# Patient Record
Sex: Female | Born: 1995 | Race: White | Hispanic: No | Marital: Single | Smoking: Never smoker
Health system: Southern US, Community
[De-identification: ages and names within clinical notes are randomized; demographics above are authoritative.]

## PROBLEM LIST (undated history)

## (undated) DIAGNOSIS — F32A Depression, unspecified: Secondary | ICD-10-CM

## (undated) DIAGNOSIS — F419 Anxiety disorder, unspecified: Secondary | ICD-10-CM

## (undated) DIAGNOSIS — N179 Acute kidney failure, unspecified: Secondary | ICD-10-CM

## (undated) DIAGNOSIS — F329 Major depressive disorder, single episode, unspecified: Secondary | ICD-10-CM

## (undated) HISTORY — DX: Acute kidney failure, unspecified: N17.9

## (undated) HISTORY — PX: OTHER SURGICAL HISTORY: SHX169

## (undated) HISTORY — PX: WISDOM TOOTH EXTRACTION: SHX21

---
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
## 2016-10-06 DIAGNOSIS — K219 Gastro-esophageal reflux disease without esophagitis: Secondary | ICD-10-CM

## 2016-10-06 HISTORY — DX: Gastro-esophageal reflux disease without esophagitis: K21.9

## 2019-10-26 ENCOUNTER — Ambulatory Visit: Payer: 59 | Admitting: Adult Health

## 2019-10-26 DIAGNOSIS — F411 Generalized anxiety disorder: Secondary | ICD-10-CM

## 2019-10-26 DIAGNOSIS — F909 Attention-deficit hyperactivity disorder, unspecified type: Secondary | ICD-10-CM

## 2019-10-26 DIAGNOSIS — F331 Major depressive disorder, recurrent, moderate: Secondary | ICD-10-CM

## 2019-11-03 ENCOUNTER — Ambulatory Visit: Payer: 59 | Admitting: Adult Health

## 2019-11-03 DIAGNOSIS — F489 Nonpsychotic mental disorder, unspecified: Secondary | ICD-10-CM

## 2019-11-03 NOTE — Progress Notes (Deleted)
[facility] MD/PA/NP Initial Note  11/03/2019 9:04 AM Wendy Vega  MRN:  [id number]   .  Chief Complaint:  Chief Complaint    ADHD      HPI: ***  Visit Diagnosis: No diagnosis found.  Past Psychiatric History:   Past Medical History:  Past Medical History:  Diagnosis Date  . Anxiety   . Depression     Past Surgical History:  Procedure Laterality Date  . left arm surgery       Family Psychiatric History: ***  Family History:  Family History  Problem Relation Age of Onset  . Cancer Other   . Hyperlipidemia Other     Social History:  Social History   Socioeconomic History  . Marital status: Single    Spouse name: Not on file  . Number of children: Not on file  . Years of education: Not on file  . Highest education level: Not on file  Occupational History  . Not on file  Tobacco Use  . Smoking status: Never Smoker  Substance and Sexual Activity  . Alcohol use: Yes    Comment: occ  . Drug use: No  . Sexual activity: Yes    Birth control/protection: None  Other Topics Concern  . Not on file  Social History Narrative  . Not on file   Social Determinants of Health   Financial Resource Strain:   . Difficulty of Paying Living Expenses: Not on file  Food Insecurity:   . Worried About Programme researcher, broadcasting/film/video in the Last Year: Not on file  . Ran Out of Food in the Last Year: Not on file  Transportation Needs:   . Lack of Transportation (Medical): Not on file  . Lack of Transportation (Non-Medical): Not on file  Physical Activity:   . Days of Exercise per Week: Not on file  . Minutes of Exercise per Session: Not on file  Stress:   . Feeling of Stress : Not on file  Social Connections:   . Frequency of Communication with Friends and Family: Not on file  . Frequency of Social Gatherings with Friends and Family: Not on file  . Attends Religious Services: Not on file  . Active Member of Clubs or Organizations: Not on file  . Attends Banker  Meetings: Not on file  . Marital Status: Not on file    Allergies:  Allergies  Allergen Reactions  . Sulfa Antibiotics Other (See Comments)    Lips turn blue and swelling   . Sulfacetamide Sodium Other (See Comments)    Lips turn blue and swelling     Metabolic Disorder Labs: No results found for: HGBA1C, MPG No results found for: PROLACTIN No results found for: CHOL, TRIG, HDL, CHOLHDL, VLDL, LDLCALC No results found for: TSH  Therapeutic Level Labs: No results found for: LITHIUM No results found for: VALPROATE No components found for:  CBMZ  Current Medications: Current Outpatient Medications  Medication Sig Dispense Refill  . acetaminophen (TYLENOL) 325 MG tablet Take 2 tablets (650 mg total) by mouth every 6 (six) hours as needed. 30 tablet 0  . buPROPion (WELLBUTRIN SR) 150 MG 12 hr tablet Take 150 mg by mouth 2 (two) times daily.    Marland Kitchen dicyclomine (BENTYL) 20 MG tablet Take 1 tablet (20 mg total) by mouth 2 (two) times daily. 20 tablet 0  . doxycycline (VIBRAMYCIN) 100 MG capsule Take 1 capsule (100 mg total) by mouth 2 (two) times daily. 20 capsule 0  .  fluticasone (CUTIVATE) 0.05 % cream Apply topically 2 (two) times daily. 30 g 0  . gabapentin (NEURONTIN) 300 MG capsule Take 300 mg by mouth 3 (three) times daily.    Marland Kitchen ibuprofen (ADVIL,MOTRIN) 200 MG tablet Take 400 mg by mouth every 6 (six) hours as needed for moderate pain.    Marland Kitchen ibuprofen (ADVIL,MOTRIN) 400 MG tablet Take 1 tablet (400 mg total) by mouth every 6 (six) hours as needed. 30 tablet 0  . naproxen sodium (ANAPROX) 220 MG tablet Take 440 mg by mouth 2 (two) times daily as needed (pain).    . ondansetron (ZOFRAN ODT) 4 MG disintegrating tablet Take 1 tablet (4 mg total) by mouth every 8 (eight) hours as needed for nausea. 10 tablet 0  . Sertraline HCl (ZOLOFT PO) Take by mouth.     No current facility-administered medications for this visit.    Medication Side Effects: {Medication Side Effects  (Optional):12147}  Orders placed this visit:  No orders of the defined types were placed in this encounter.   Psychiatric Specialty Exam:  Review of Systems  There were no vitals taken for this visit.There is no height or weight on file to calculate BMI.  General Appearance: {[phone]}  Eye Contact:  {PSY:22684}  Speech:  {PSY:250-246-1841}  Volume:  {PSY:22686}  Mood:  {PSY:22306}  Affect:  {PSY:2764519662}  Thought Process:  {PSY:22688}  Orientation:  {PSY:22689}  Thought Content: {PSY:22690}   Suicidal Thoughts:  {PSY:22692}  Homicidal Thoughts:  {PSY:22692}  Memory:  {PSY:360-089-5942}  Judgement:  {PSY:22694}  Insight:  {PSY:22695}  Psychomotor Activity:  {PSY:22696}  Concentration:  {BOF:75102}  Recall:  {HEN:27782}  Fund of Knowledge: {PSY:22877}  Language: {UMP:53614}  Assets:  {ERX:54008}  ADL's:  {QPY:19509}  Cognition: {TOI:712458099}  Prognosis:  {PSY:22877}   Screenings:   Receiving Psychotherapy: {IPJ:82505}  Treatment Plan/Recommendations: ***    Aloha Gell, NP

## 2019-11-16 ENCOUNTER — Ambulatory Visit (INDEPENDENT_AMBULATORY_CARE_PROVIDER_SITE_OTHER): Payer: 59 | Admitting: Adult Health

## 2019-11-16 ENCOUNTER — Encounter: Payer: Self-pay | Admitting: Adult Health

## 2019-11-16 DIAGNOSIS — F5081 Binge eating disorder: Secondary | ICD-10-CM | POA: Diagnosis not present

## 2019-11-16 DIAGNOSIS — F331 Major depressive disorder, recurrent, moderate: Secondary | ICD-10-CM

## 2019-11-16 DIAGNOSIS — F909 Attention-deficit hyperactivity disorder, unspecified type: Secondary | ICD-10-CM | POA: Diagnosis not present

## 2019-11-16 DIAGNOSIS — F411 Generalized anxiety disorder: Secondary | ICD-10-CM | POA: Diagnosis not present

## 2019-11-16 MED ORDER — LISDEXAMFETAMINE DIMESYLATE 20 MG PO CAPS: 20.0000 mg | ORAL_CAPSULE | Freq: Two times a day (BID) | ORAL | 0 refills | Status: DC

## 2019-11-16 MED ORDER — ESCITALOPRAM OXALATE 20 MG PO TABS: 20.0000 mg | ORAL_TABLET | Freq: Two times a day (BID) | ORAL | 5 refills | Status: DC

## 2019-11-16 NOTE — Progress Notes (Signed)
[facility] MD/PA/NP Initial Note  11/16/2019 4:56 PM Wendy Vega  MRN:  [id number]  Chief Complaint:  Chief Complaint    ADHD; Anxiety; Depression      HPI:   Describes mood today as "ok". Pleasant. Mood symptoms - denies depression, anxiety, and irritability. Stating "i'm doing pretty good right now". Feels like medications are working "well". Stable interest and motivation. Taking medications as prescribed.  Energy levels stable. Active, does not have a regular exercise routine. Walking some days. Plans to get a part time job.  Enjoys some usual interests and activities. Single. Lives with 2 roommates. No pets. Coming home "some". Spending time with family. Appetite adequate. Weight gain with Covid. Denies any issues with "eating disorder". Sleeps well most nights. Averages 10 to 12 hours. Focus and concentration stable with Vyvanse. 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 Completing tasks. Managing aspects of household.  Denies SI or HI. Denies AH or VH.  Previous medication trials: Wellbutrin, Zoloft  Visit Diagnosis:    ICD-10-CM   1. Binge eating disorder  F50.81 lisdexamfetamine (VYVANSE) 20 MG capsule    lisdexamfetamine (VYVANSE) 20 MG capsule    lisdexamfetamine (VYVANSE) 20 MG capsule  2. Generalized anxiety disorder  F41.1 escitalopram (LEXAPRO) 20 MG tablet  3. Major depressive disorder, recurrent episode, moderate (HCC)  F33.1 escitalopram (LEXAPRO) 20 MG tablet  4. Attention deficit hyperactivity disorder (ADHD), unspecified ADHD type  F90.9 lisdexamfetamine (VYVANSE) 20 MG capsule    lisdexamfetamine (VYVANSE) 20 MG capsule    lisdexamfetamine (VYVANSE) 20 MG capsule    Past Psychiatric History: Denies psychiatric hospitalization.  Past Medical History:  Past Medical History:  Diagnosis Date  . Anxiety   . Depression     Past Surgical History:  Procedure Laterality Date  . left arm surgery       Family Psychiatric History: Denies any family  history of mental illness.  Family History:  Family History  Problem Relation Age of Onset  . Cancer Other   . Hyperlipidemia Other     Social History:  Social History   Socioeconomic History  . Marital status: Single    Spouse name: Not on file  . Number of children: Not on file  . Years of education: Not on file  . Highest education level: Not on file  Occupational History  . Not on file  Tobacco Use  . Smoking status: Never Smoker  Substance and Sexual Activity  . Alcohol use: Yes    Comment: occ  . Drug use: No  . Sexual activity: Yes    Birth control/protection: None  Other Topics Concern  . Not on file  Social History Narrative  . Not on file   Social Determinants of Health   Financial Resource Strain:   . Difficulty of Paying Living Expenses: Not on file  Food Insecurity:   . Worried About Programme researcher, broadcasting/film/video in the Last Year: Not on file  . Ran Out of Food in the Last Year: Not on file  Transportation Needs:   . Lack of Transportation (Medical): Not on file  . Lack of Transportation (Non-Medical): Not on file  Physical Activity:   . Days of Exercise per Week: Not on file  . Minutes of Exercise per Session: Not on file  Stress:   . Feeling of Stress : Not on file  Social Connections:   . Frequency of Communication with Friends and Family: Not on file  . Frequency of Social Gatherings with  Friends and Family: Not on file  . Attends Religious Services: Not on file  . Active Member of Clubs or Organizations: Not on file  . Attends Archivist Meetings: Not on file  . Marital Status: Not on file    Allergies:  Allergies  Allergen Reactions  . Sulfa Antibiotics Other (See Comments)    Lips turn blue and swelling   . Sulfacetamide Sodium Other (See Comments)    Lips turn blue and swelling     Metabolic Disorder Labs: No results found for: HGBA1C, MPG No results found for: PROLACTIN No results found for: CHOL, TRIG, HDL, CHOLHDL, VLDL,  LDLCALC No results found for: TSH  Therapeutic Level Labs: No results found for: LITHIUM No results found for: VALPROATE No components found for:  CBMZ  Current Medications: Current Outpatient Medications  Medication Sig Dispense Refill  . escitalopram (LEXAPRO) 20 MG tablet Take 1 tablet (20 mg total) by mouth 2 (two) times daily. 60 tablet 5  . lisdexamfetamine (VYVANSE) 20 MG capsule Take 1 capsule (20 mg total) by mouth 2 (two) times daily. 60 capsule 0  . [START ON 12/14/2019] lisdexamfetamine (VYVANSE) 20 MG capsule Take 1 capsule (20 mg total) by mouth 2 (two) times daily. 60 capsule 0  . [START ON 01/11/2020] lisdexamfetamine (VYVANSE) 20 MG capsule Take 1 capsule (20 mg total) by mouth 2 (two) times daily. 60 capsule 0  . VYVANSE 20 MG capsule Take 20 mg by mouth 2 (two) times daily.     No current facility-administered medications for this visit.    Medication Side Effects: none  Orders placed this visit:  No orders of the defined types were placed in this encounter.   Psychiatric Specialty Exam:  Review of Systems  Musculoskeletal: Negative for gait problem.  Neurological: Negative for tremors.  Psychiatric/Behavioral:       Please refer to HPI    There were no vitals taken for this visit.There is no height or weight on file to calculate BMI.  General Appearance: uta  Eye Contact:  uta  Speech:  Clear and Coherent and Normal Rate  Volume:  Normal  Mood:  Euthymic  Affect:  Appropriate and Congruent  Thought Process:  Coherent and Descriptions of Associations: Intact  Orientation:  Full (Time, Place, and Person)  Thought Content: Logical   Suicidal Thoughts:  No  Homicidal Thoughts:  No  Memory:  WNL  Judgement:  Good  Insight:  Good  Psychomotor Activity:  Normal  Concentration:  Concentration: Good  Recall:  Good  Fund of Knowledge: Good  Language: Good  Assets:  Communication Skills Desire for Improvement Financial  Resources/Insurance Housing Intimacy Leisure Time Physical Health Resilience Social Support Talents/Skills Transportation Vocational/Educational  ADL's:  Intact  Cognition: WNL  Prognosis:  Good   Screenings: None  Receiving Psychotherapy: No   Treatment Plan/Recommendations:   Plan:  PDMP reviewed  1. Lexapro 40mg  daily 2. Vyvanse 20mg  twice daily  IUD  RTC 3 months  Patient advised to contact office with any questions, adverse effects, or acute worsening in signs and symptoms.  Discussed potential benefits, risks, and side effects of stimulants with patient to include increased heart rate, palpitations, insomnia, increased anxiety, increased irritability, or decreased appetite.  Instructed patient to contact office if experiencing any significant tolerability issues.   Aloha Gell, NP

## 2019-11-25 ENCOUNTER — Telehealth: Payer: Self-pay

## 2019-11-25 NOTE — Telephone Encounter (Signed)
Prior authorization submitted for Vyvanse 20 mg through [facility] with [employer] [facility] [place]#[id number], pending response

## 2019-11-28 NOTE — Telephone Encounter (Signed)
Prior authorization approved for Vyvanse effective 11/25/2019-11/24/2020, PA # [id number]

## 2020-02-27 ENCOUNTER — Telehealth: Payer: Self-pay | Admitting: Adult Health

## 2020-02-27 ENCOUNTER — Other Ambulatory Visit: Payer: Self-pay

## 2020-02-27 DIAGNOSIS — F5081 Binge eating disorder: Secondary | ICD-10-CM

## 2020-02-27 DIAGNOSIS — F909 Attention-deficit hyperactivity disorder, unspecified type: Secondary | ICD-10-CM

## 2020-02-27 MED ORDER — LISDEXAMFETAMINE DIMESYLATE 20 MG PO CAPS: 20.0000 mg | ORAL_CAPSULE | Freq: Two times a day (BID) | ORAL | 0 refills | Status: DC

## 2020-02-27 NOTE — Telephone Encounter (Signed)
Contacted pharmacy they did have 2 other Rx's on file but they expired after 90 days so she lost those. Will pend for 1 Rx to be sent, she's due for refill.  Patient is also due for apt.

## 2020-02-27 NOTE — Telephone Encounter (Signed)
Noted  

## 2020-02-27 NOTE — Telephone Encounter (Signed)
Pt left message requesting refill for Vyvanse @ [facility] on file. LM on VM  need follow up apt. Was due 02/13/20

## 2020-02-27 NOTE — Telephone Encounter (Signed)
Patient should already have a Rx on file to fill for Vyvanse, will contact [facility] to confirm first

## 2020-02-29 ENCOUNTER — Telehealth: Payer: Self-pay

## 2020-02-29 NOTE — Telephone Encounter (Signed)
Received prior authorization request for VYVANSE 20 MG #60 for 30 day, patient has been on this dose for many years, diagnosed with Binge Eating Disorder and also ADHD. [facility] requesting office notes to be faxed to [facility] at [phone] f.,p. [phone]  Notes faxed this morning.   Pending response

## 2020-02-29 NOTE — Telephone Encounter (Signed)
Pt has apt for 5/27

## 2020-02-29 NOTE — Telephone Encounter (Signed)
Prior authorization approved for Vyvanse 20 mg bid effective 02/29/2020-02/28/2021, PA# [id number] With [facility]

## 2020-03-01 ENCOUNTER — Telehealth (INDEPENDENT_AMBULATORY_CARE_PROVIDER_SITE_OTHER): Payer: 59 | Admitting: Adult Health

## 2020-03-01 DIAGNOSIS — F909 Attention-deficit hyperactivity disorder, unspecified type: Secondary | ICD-10-CM

## 2020-03-01 DIAGNOSIS — F411 Generalized anxiety disorder: Secondary | ICD-10-CM | POA: Diagnosis not present

## 2020-03-01 DIAGNOSIS — F331 Major depressive disorder, recurrent, moderate: Secondary | ICD-10-CM | POA: Diagnosis not present

## 2020-03-01 DIAGNOSIS — F5081 Binge eating disorder: Secondary | ICD-10-CM | POA: Diagnosis not present

## 2020-03-01 MED ORDER — LISDEXAMFETAMINE DIMESYLATE 20 MG PO CAPS: 20.0000 mg | ORAL_CAPSULE | Freq: Two times a day (BID) | ORAL | 0 refills | Status: DC

## 2020-03-01 NOTE — Progress Notes (Signed)
Wendy Vega [id number] [birth year] 24 y.o.  Virtual Visit via Telephone Note  I connected with pt on 03/01/20 at  2:00 PM EDT by telephone and verified that I am speaking with the correct person using two identifiers.   I discussed the limitations, risks, security and privacy concerns of performing an evaluation and management service by telephone and the availability of in person appointments. I also discussed with the patient that there may be a patient responsible charge related to this service. The patient expressed understanding and agreed to proceed.   I discussed the assessment and treatment plan with the patient. The patient was provided an opportunity to ask questions and all were answered. The patient agreed with the plan and demonstrated an understanding of the instructions.   The patient was advised to call back or seek an in-person evaluation if the symptoms worsen or if the condition fails to improve as anticipated.  I provided 30 minutes of non-face-to-face time during this encounter.  The patient was located at home.  The provider was located at [place].   Aloha Gell, NP   Subjective:   Patient ID:  Wendy Vega is a 24 y.o. (DOB [birth year]) female.  Chief Complaint: No chief complaint on file.   HPI [name] presents for follow-up of MDD, GAD, Binge eating disorder, ADHD.  Describes mood today as "ok". Pleasant. Mood symptoms - denies depression, anxiety, and irritability. Stating "everything is going pretty good". Feels like medications are working "well". Stable interest and motivation. Taking medications as prescribed.  Energy levels stable. Active, does not have a regular exercise routine. Walking some days.  Enjoys some usual interests and activities. Single. Lives with 2 roommates. No pets. Coming home permanently in July.   Appetite adequate. Weight gain - "a little'.   Sleeps well most nights. Averages 10 to 12 hours. Focus  and concentration stable. Recently graduated from [employer]. Completing tasks. Managing aspects of household.  Denies SI or HI. Denies AH or VH.  Previous medication trials: Wellbutrin, Zoloft  Review of Systems:  Review of Systems  Musculoskeletal: Negative for gait problem.  Neurological: Negative for tremors.  Psychiatric/Behavioral:       Please refer to HPI    Medications: I have reviewed the patient's current medications.  Current Outpatient Medications  Medication Sig Dispense Refill  . escitalopram (LEXAPRO) 20 MG tablet Take 1 tablet (20 mg total) by mouth 2 (two) times daily. 60 tablet 5  . lisdexamfetamine (VYVANSE) 20 MG capsule Take 1 capsule (20 mg total) by mouth 2 (two) times daily. 60 capsule 0  . [START ON 03/29/2020] lisdexamfetamine (VYVANSE) 20 MG capsule Take 1 capsule (20 mg total) by mouth 2 (two) times daily. 60 capsule 0  . [START ON 04/26/2020] lisdexamfetamine (VYVANSE) 20 MG capsule Take 1 capsule (20 mg total) by mouth 2 (two) times daily. 60 capsule 0   No current facility-administered medications for this visit.    Medication Side Effects: None  Allergies:  Allergies  Allergen Reactions  . Sulfa Antibiotics Other (See Comments)    Lips turn blue and swelling   . Sulfacetamide Sodium Other (See Comments)    Lips turn blue and swelling     Past Medical History:  Diagnosis Date  . Anxiety   . Depression     Family History  Problem Relation Age of Onset  . Cancer Other   . Hyperlipidemia Other     Social History   Socioeconomic History  .  Marital status: Single    Spouse name: Not on file  . Number of children: Not on file  . Years of education: Not on file  . Highest education level: Not on file  Occupational History  . Not on file  Tobacco Use  . Smoking status: Never Smoker  . Smokeless tobacco: Never Used  Substance and Sexual Activity  . Alcohol use: Yes    Comment: occ  . Drug use: No  . Sexual activity: Yes    Birth  control/protection: None  Other Topics Concern  . Not on file  Social History Narrative  . Not on file   Social Determinants of Health   Financial Resource Strain:   . Difficulty of Paying Living Expenses:   Food Insecurity:   . Worried About Programme researcher, broadcasting/film/video in the Last Year:   . Barista in the Last Year:   Transportation Needs:   . Freight forwarder (Medical):   Marland Kitchen Lack of Transportation (Non-Medical):   Physical Activity:   . Days of Exercise per Week:   . Minutes of Exercise per Session:   Stress:   . Feeling of Stress :   Social Connections:   . Frequency of Communication with Friends and Family:   . Frequency of Social Gatherings with Friends and Family:   . Attends Religious Services:   . Active Member of Clubs or Organizations:   . Attends Banker Meetings:   Marland Kitchen Marital Status:   Intimate Partner Violence:   . Fear of Current or Ex-Partner:   . Emotionally Abused:   Marland Kitchen Physically Abused:   . Sexually Abused:     Past Medical History, Surgical history, Social history, and Family history were reviewed and updated as appropriate.   Please see review of systems for further details on the patient's review from today.   Objective:   Physical Exam:  There were no vitals taken for this visit.  Physical Exam Constitutional:      General: She is not in acute distress. Musculoskeletal:        General: No deformity.  Neurological:     Mental Status: She is alert and oriented to person, place, and time.     Coordination: Coordination normal.  Psychiatric:        Attention and Perception: Attention and perception normal. She does not perceive auditory or visual hallucinations.        Mood and Affect: Mood normal. Mood is not anxious or depressed. Affect is not labile, blunt, angry or inappropriate.        Speech: Speech normal.        Behavior: Behavior normal.        Thought Content: Thought content normal. Thought content is not paranoid  or delusional. Thought content does not include homicidal or suicidal ideation. Thought content does not include homicidal or suicidal plan.        Cognition and Memory: Cognition and memory normal.        Judgment: Judgment normal.     Comments: Insight intact     Lab Review:     Component Value Date/Time   NA 141 09/14/2014 2005   K 4.5 09/14/2014 2005   CL 103 09/14/2014 2005   CO2 25 09/14/2014 2005   GLUCOSE 81 09/14/2014 2005   BUN 9 09/14/2014 2005   CREATININE 0.55 09/14/2014 2005   CALCIUM 9.9 09/14/2014 2005   PROT 7.7 09/14/2014 2005   ALBUMIN 4.1 09/14/2014 2005  AST 17 09/14/2014 2005   ALT 13 09/14/2014 2005   ALKPHOS 93 09/14/2014 2005   BILITOT <0.2 (L) 09/14/2014 2005   GFRNONAA NOT CALCULATED 09/14/2014 2005   GFRAA NOT CALCULATED 09/14/2014 2005       Component Value Date/Time   WBC 9.5 09/14/2014 2005   RBC 4.64 09/14/2014 2005   HGB 13.3 09/14/2014 2005   HCT 39.9 09/14/2014 2005   PLT 240 09/14/2014 2005   MCV 86.0 09/14/2014 2005   MCH 28.7 09/14/2014 2005   MCHC 33.3 09/14/2014 2005   RDW 12.4 09/14/2014 2005   LYMPHSABS 2.6 09/14/2014 2005   MONOABS 0.9 09/14/2014 2005   EOSABS 0.4 09/14/2014 2005   BASOSABS 0.1 09/14/2014 2005    No results found for: POCLITH, LITHIUM   No results found for: PHENYTOIN, PHENOBARB, VALPROATE, CBMZ   .res Assessment: Plan:    Plan:  PDMP reviewed  1. Lexapro 40mg  daily 2. Vyvanse 20mg  twice daily  RTC 3 months  Patient advised to contact office with any questions, adverse effects, or acute worsening in signs and symptoms.  Discussed potential benefits, risks, and side effects of stimulants with patient to include increased heart rate, palpitations, insomnia, increased anxiety, increased irritability, or decreased appetite.  Instructed patient to contact office if experiencing any significant tolerability issues.    Diagnoses and all orders for this visit:  Generalized anxiety  disorder  Binge eating disorder -     lisdexamfetamine (VYVANSE) 20 MG capsule; Take 1 capsule (20 mg total) by mouth 2 (two) times daily. -     lisdexamfetamine (VYVANSE) 20 MG capsule; Take 1 capsule (20 mg total) by mouth 2 (two) times daily. -     lisdexamfetamine (VYVANSE) 20 MG capsule; Take 1 capsule (20 mg total) by mouth 2 (two) times daily.  Major depressive disorder, recurrent episode, moderate (HCC)  Attention deficit hyperactivity disorder (ADHD), unspecified ADHD type -     lisdexamfetamine (VYVANSE) 20 MG capsule; Take 1 capsule (20 mg total) by mouth 2 (two) times daily. -     lisdexamfetamine (VYVANSE) 20 MG capsule; Take 1 capsule (20 mg total) by mouth 2 (two) times daily. -     lisdexamfetamine (VYVANSE) 20 MG capsule; Take 1 capsule (20 mg total) by mouth 2 (two) times daily.    Please see After Visit Summary for patient specific instructions.  No future appointments.  No orders of the defined types were placed in this encounter.     -------------------------------

## 2020-04-05 DIAGNOSIS — R569 Unspecified convulsions: Secondary | ICD-10-CM

## 2020-04-05 HISTORY — DX: Unspecified convulsions: R56.9

## 2020-05-10 ENCOUNTER — Encounter (HOSPITAL_COMMUNITY): Payer: Self-pay | Admitting: Emergency Medicine

## 2020-05-10 ENCOUNTER — Emergency Department (HOSPITAL_COMMUNITY)
Admission: EM | Admit: 2020-05-10 | Discharge: 2020-05-11 | Disposition: A | Discharge status: Home / Self Care | Payer: 59 | Attending: Emergency Medicine | Admitting: Emergency Medicine

## 2020-05-10 ENCOUNTER — Other Ambulatory Visit: Payer: Self-pay

## 2020-05-10 DIAGNOSIS — R519 Headache, unspecified: Secondary | ICD-10-CM | POA: Insufficient documentation

## 2020-05-10 DIAGNOSIS — M436 Torticollis: Secondary | ICD-10-CM | POA: Insufficient documentation

## 2020-05-10 DIAGNOSIS — Z5321 Procedure and treatment not carried out due to patient leaving prior to being seen by health care provider: Secondary | ICD-10-CM | POA: Diagnosis not present

## 2020-05-10 DIAGNOSIS — R112 Nausea with vomiting, unspecified: Secondary | ICD-10-CM | POA: Diagnosis not present

## 2020-05-10 DIAGNOSIS — M791 Myalgia, unspecified site: Secondary | ICD-10-CM | POA: Insufficient documentation

## 2020-05-10 LAB — I-STAT BETA HCG BLOOD, ED (MC, WL, AP ONLY): I-stat hCG, quantitative: <5 m[IU]/mL (ref <5)

## 2020-05-10 LAB — CBC
HCT: 42.6 % (ref 36.0–46.0)
Hemoglobin: 14.3 g/dL (ref 12.0–15.0)
MCH: 30.6 pg (ref 26.0–34.0)
MCHC: 33.6 g/dL (ref 30.0–36.0)
MCV: 91.2 fL (ref 80.0–100.0)
Platelets: 266 10*3/uL (ref 150–400)
RBC: 4.67 MIL/uL (ref 3.87–5.11)
RDW: 12.4 % (ref 11.5–15.5)
WBC: 21.7 10*3/uL — ABNORMAL HIGH (ref 4.0–10.5)
nRBC: 0 % (ref 0.0–0.2)

## 2020-05-10 LAB — COMPREHENSIVE METABOLIC PANEL
ALT: 18 U/L (ref 0–44)
AST: 30 U/L (ref 15–41)
Albumin: 4.6 g/dL (ref 3.5–5.0)
Alkaline Phosphatase: 71 U/L (ref 38–126)
Anion gap: 11 (ref 5–15)
BUN: 7 mg/dL (ref 6–20)
CO2: 20 mmol/L — ABNORMAL LOW (ref 22–32)
Calcium: 8.9 mg/dL (ref 8.9–10.3)
Chloride: 105 mmol/L (ref 98–111)
Creatinine, Ser: 0.7 mg/dL (ref 0.44–1.00)
GFR calc Af Amer: >60 mL/min (ref >60)
GFR calc non Af Amer: >60 mL/min (ref >60)
Glucose, Bld: 142 mg/dL — ABNORMAL HIGH (ref 70–99)
Potassium: 3.9 mmol/L (ref 3.5–5.1)
Sodium: 136 mmol/L (ref 135–145)
Total Bilirubin: 0.4 mg/dL (ref 0.3–1.2)
Total Protein: 7.9 g/dL (ref 6.5–8.1)

## 2020-05-10 LAB — LIPASE, BLOOD: Lipase: 21 U/L (ref 11–51)

## 2020-05-10 NOTE — ED Triage Notes (Signed)
Patient c/o headache and neck stiffness since waking from a nap today. Also reports N/V and body aches today. Denies fevers.

## 2020-05-11 ENCOUNTER — Encounter (HOSPITAL_BASED_OUTPATIENT_CLINIC_OR_DEPARTMENT_OTHER): Payer: Self-pay | Admitting: Emergency Medicine

## 2020-05-11 ENCOUNTER — Other Ambulatory Visit: Payer: Self-pay

## 2020-05-11 ENCOUNTER — Emergency Department (HOSPITAL_BASED_OUTPATIENT_CLINIC_OR_DEPARTMENT_OTHER): Payer: 59

## 2020-05-11 ENCOUNTER — Emergency Department (HOSPITAL_BASED_OUTPATIENT_CLINIC_OR_DEPARTMENT_OTHER)
Admission: EM | Admit: 2020-05-11 | Discharge: 2020-05-11 | Disposition: A | Discharge status: Home / Self Care | Payer: 59 | Source: Home / Self Care | Attending: Emergency Medicine | Admitting: Emergency Medicine

## 2020-05-11 DIAGNOSIS — R112 Nausea with vomiting, unspecified: Secondary | ICD-10-CM

## 2020-05-11 DIAGNOSIS — M542 Cervicalgia: Secondary | ICD-10-CM | POA: Insufficient documentation

## 2020-05-11 DIAGNOSIS — M791 Myalgia, unspecified site: Secondary | ICD-10-CM | POA: Insufficient documentation

## 2020-05-11 DIAGNOSIS — R410 Disorientation, unspecified: Secondary | ICD-10-CM | POA: Insufficient documentation

## 2020-05-11 DIAGNOSIS — R519 Headache, unspecified: Secondary | ICD-10-CM

## 2020-05-11 LAB — COMPREHENSIVE METABOLIC PANEL
ALT: 19 U/L (ref 0–44)
AST: 36 U/L (ref 15–41)
Albumin: 4.4 g/dL (ref 3.5–5.0)
Alkaline Phosphatase: 68 U/L (ref 38–126)
Anion gap: 12 (ref 5–15)
BUN: 6 mg/dL (ref 6–20)
CO2: 22 mmol/L (ref 22–32)
Calcium: 9.3 mg/dL (ref 8.9–10.3)
Chloride: 101 mmol/L (ref 98–111)
Creatinine, Ser: 0.63 mg/dL (ref 0.44–1.00)
GFR calc Af Amer: >60 mL/min (ref >60)
GFR calc non Af Amer: >60 mL/min (ref >60)
Glucose, Bld: 109 mg/dL — ABNORMAL HIGH (ref 70–99)
Potassium: 3.6 mmol/L (ref 3.5–5.1)
Sodium: 135 mmol/L (ref 135–145)
Total Bilirubin: 0.6 mg/dL (ref 0.3–1.2)
Total Protein: 7.9 g/dL (ref 6.5–8.1)

## 2020-05-11 LAB — CBC WITH DIFFERENTIAL/PLATELET
Abs Immature Granulocytes: 0.06 10*3/uL (ref 0.00–0.07)
Basophils Absolute: 0 10*3/uL (ref 0.0–0.1)
Basophils Relative: 0 %
Eosinophils Absolute: 0 10*3/uL (ref 0.0–0.5)
Eosinophils Relative: 0 %
HCT: 40.7 % (ref 36.0–46.0)
Hemoglobin: 13.9 g/dL (ref 12.0–15.0)
Immature Granulocytes: 0 %
Lymphocytes Relative: 9 %
Lymphs Abs: 1.4 10*3/uL (ref 0.7–4.0)
MCH: 30.8 pg (ref 26.0–34.0)
MCHC: 34.2 g/dL (ref 30.0–36.0)
MCV: 90 fL (ref 80.0–100.0)
Monocytes Absolute: 0.7 10*3/uL (ref 0.1–1.0)
Monocytes Relative: 5 %
Neutro Abs: 12.4 10*3/uL — ABNORMAL HIGH (ref 1.7–7.7)
Neutrophils Relative %: 86 %
Platelets: 271 10*3/uL (ref 150–400)
RBC: 4.52 MIL/uL (ref 3.87–5.11)
RDW: 12.4 % (ref 11.5–15.5)
WBC: 14.6 10*3/uL — ABNORMAL HIGH (ref 4.0–10.5)
nRBC: 0 % (ref 0.0–0.2)

## 2020-05-11 MED ORDER — IOHEXOL 350 MG/ML SOLN
100.0000 mL | Freq: Once | INTRAVENOUS | Status: AC | PRN
  Administered 2020-05-11: 100 mL via INTRAVENOUS

## 2020-05-11 NOTE — ED Triage Notes (Signed)
Reports waking up yesterday and found she had vomited on herself.  Went to UC and was sent to [facility].  Did not wait to be seen due to 6 hour wait.  Reports today still has a headache and body aches.  Reports feeling confused yesterday but is better today.

## 2020-05-11 NOTE — Discharge Instructions (Signed)
Please read and follow all provided instructions.  Your diagnoses today include:  1. Acute nonintractable headache, unspecified headache type   2. Non-intractable vomiting with nausea, unspecified vomiting type     Tests performed today include:  CT angiography of the brain -does not show signs of bleeding in the brain, abnormal blood vessels including aneurysms, tumors, or other problems  Blood counts electrolytes -white blood cell count is still high, but better from yesterday.  Vital signs. See below for your results today.   Medications prescribed:   None  Take any prescribed medications only as directed.  Additional information:  Follow any educational materials contained in this packet.  You are having a headache. No specific cause was found today for your headache. It may have been a migraine or other cause of headache. Stress, anxiety, fatigue, and depression are common triggers for headaches.   Your headache today does not appear to be life-threatening or require hospitalization, but often the exact cause of headaches is not determined in the emergency department. Therefore, follow-up with your doctor is very important to find out what may have caused your headache and whether or not you need any further diagnostic testing or treatment.   Sometimes headaches can appear benign (not harmful), but then more serious symptoms can develop which should prompt an immediate re-evaluation by your doctor or the emergency department.  BE VERY CAREFUL not to take multiple medicines containing Tylenol (also called acetaminophen). Doing so can lead to an overdose which can damage your liver and cause liver failure and possibly death.   Follow-up instructions: Please follow-up with your primary care provider in the next 3 days for further evaluation of your symptoms.   Return instructions:   Please return to the Emergency Department if you experience worsening symptoms.  Return if the  medications do not resolve your headache, if it recurs, or if you have multiple episodes of vomiting or cannot keep down fluids.  Return if you have a change from the usual headache.  RETURN IMMEDIATELY IF you:  Develop a sudden, severe headache  Develop confusion or become poorly responsive or faint  Develop a fever above 100.34F or problem breathing  Have a change in speech, vision, swallowing, or understanding  Develop new weakness, numbness, tingling, incoordination in your arms or legs  Have a seizure  Please return if you have any other emergent concerns.  Additional Information:  Your vital signs today were: BP 130/78   Pulse 71   Temp 98.2 F (36.8 C) (Oral)   Resp 20   Ht 5\' 9"  (1.753 m)   Wt 97.5 kg   LMP 05/03/2020   SpO2 96%   BMI 31.75 kg/m  If your blood pressure (BP) was elevated above 135/85 this visit, please have this repeated by your doctor within one month. --------------

## 2020-05-11 NOTE — ED Notes (Addendum)
Pt reports sudden onset of "pounding" HA yesterday with some associated amnesia, and vomiting in sleep. Pt reporting stiffness in neck and difficult with flexion. Symptoms have since resolved. No kernig's sign present.

## 2020-05-11 NOTE — ED Notes (Signed)
Patient left without being seen.

## 2020-05-11 NOTE — ED Provider Notes (Signed)
[facility] EMERGENCY DEPARTMENT Provider Note   CSN: [id number] Arrival date & time: 05/11/20  1435     History Chief Complaint  Patient presents with   Headache   Generalized Body Aches    Wendy Vega is a 24 y.o. female.  Patient with history of anxiety depression presents the emergency department with complaint of nausea vomiting and headache starting yesterday.  Patient recently returned home from graduating from [employer].  2 days ago she was feeling normal.  Yesterday morning she awoke with vomit next to her bed.  She had thrown up in the middle the night without waking up.  She continued to feel generally unwell but has limited memory of events from yesterday morning.  She reported generalized body aches with pain in her calves  She notes that she took a nap at some point and awoke with more vomit on herself.  She developed a severe headache with neck pain around 4 PM yesterday.  Headache is the worst that she has ever had.  This was associated with more vomiting.  When patient's mother returned home, she states that she was not talkative with mumbling speech.  Mother helped her change her clothes and patient does not remember this.  She was not acting like herself.  They went to urgent care and she was referred to the emergency department.  She began to feel better while in the emergency department waiting to be seen.  She had labs drawn at that time.  They eventually left without being seen.  Patient awoke today feeling much better.  No migraine history in patient or family. No aneurysm history in patient or family.         Past Medical History:  Diagnosis Date   Anxiety    Depression     There are no problems to display for this patient.   Past Surgical History:  Procedure Laterality Date   left arm surgery        OB History   No obstetric history on file.     Family History  Problem Relation Age of Onset   Cancer Other     Hyperlipidemia Other     Social History   Tobacco Use   Smoking status: Never Smoker   Smokeless tobacco: Never Used  Substance Use Topics   Alcohol use: Yes    Comment: occ   Drug use: No    Home Medications Prior to Admission medications   Medication Sig Start Date End Date Taking? Authorizing Provider  escitalopram (LEXAPRO) 20 MG tablet Take 1 tablet (20 mg total) by mouth 2 (two) times daily. 11/16/19  Yes Mozingo, Thereasa Soloegina Nattalie, NP  lisdexamfetamine (VYVANSE) 20 MG capsule Take 1 capsule (20 mg total) by mouth 2 (two) times daily. 03/01/20  Yes Mozingo, Thereasa Soloegina Nattalie, NP  lisdexamfetamine (VYVANSE) 20 MG capsule Take 1 capsule (20 mg total) by mouth 2 (two) times daily. 03/29/20   Mozingo, Thereasa Soloegina Nattalie, NP  lisdexamfetamine (VYVANSE) 20 MG capsule Take 1 capsule (20 mg total) by mouth 2 (two) times daily. 04/26/20   Mozingo, Thereasa Soloegina Nattalie, NP    Allergies    Sulfa antibiotics and Sulfacetamide sodium  Review of Systems   Review of Systems  Constitutional: Negative for fever.  HENT: Negative for congestion, dental problem, rhinorrhea, sinus pressure and sore throat.   Eyes: Negative for photophobia, discharge, redness and visual disturbance.  Respiratory: Negative for cough and shortness of breath.   Cardiovascular: Negative for chest  pain.  Gastrointestinal: Positive for nausea and vomiting. Negative for abdominal pain and diarrhea.  Genitourinary: Negative for dysuria, frequency, hematuria and urgency.  Musculoskeletal: Positive for myalgias, neck pain and neck stiffness. Negative for gait problem.  Skin: Negative for rash.  Neurological: Positive for speech difficulty (mumbling) and headaches. Negative for syncope, weakness, light-headedness and numbness.  Psychiatric/Behavioral: Positive for confusion.    Physical Exam Updated Vital Signs BP 130/78    Pulse 71    Temp 98.2 F (36.8 C) (Oral)    Resp 20    Ht 5\' 9"  (1.753 m)    Wt 97.5 kg    LMP 05/03/2020     SpO2 96%    BMI 31.75 kg/m   Physical Exam Vitals and nursing note reviewed.  Constitutional:      Appearance: She is well-developed.  HENT:     Head: Normocephalic and atraumatic.     Right Ear: Tympanic membrane, ear canal and external ear normal.     Left Ear: Tympanic membrane, ear canal and external ear normal.     Nose: Nose normal.     Mouth/Throat:     Pharynx: Uvula midline.  Eyes:     General: Lids are normal.     Extraocular Movements:     Right eye: No nystagmus.     Left eye: No nystagmus.     Conjunctiva/sclera: Conjunctivae normal.     Pupils: Pupils are equal, round, and reactive to light.  Neck:     Meningeal: Brudzinski's sign and Kernig's sign absent.  Cardiovascular:     Rate and Rhythm: Normal rate and regular rhythm.  Pulmonary:     Effort: Pulmonary effort is normal.     Breath sounds: Normal breath sounds.  Abdominal:     Palpations: Abdomen is soft.     Tenderness: There is no abdominal tenderness.  Musculoskeletal:     Cervical back: Normal range of motion and neck supple. No tenderness or bony tenderness.  Skin:    General: Skin is warm and dry.  Neurological:     Mental Status: She is alert and oriented to person, place, and time.     GCS: GCS eye subscore is 4. GCS verbal subscore is 5. GCS motor subscore is 6.     Cranial Nerves: No cranial nerve deficit.     Sensory: No sensory deficit.     Coordination: Coordination normal.     Gait: Gait normal.     Deep Tendon Reflexes: Reflexes are normal and symmetric.     ED Results / Procedures / Treatments   Labs (all labs ordered are listed, but only abnormal results are displayed) Labs Reviewed  CBC WITH DIFFERENTIAL/PLATELET - Abnormal; Notable for the following components:      Result Value   WBC 14.6 (*)    Neutro Abs 12.4 (*)    All other components within normal limits  COMPREHENSIVE METABOLIC PANEL - Abnormal; Notable for the following components:   Glucose, Bld 109 (*)    All  other components within normal limits    EKG None  Radiology CT Angio Head W or Wo Contrast  Result Date: 05/11/2020 CLINICAL DATA:  Severe headache.  Vomiting and confusion. EXAM: CT ANGIOGRAPHY HEAD TECHNIQUE: Multidetector CT imaging of the head was performed using the standard protocol during bolus administration of intravenous contrast. Multiplanar CT image reconstructions and MIPs were obtained to evaluate the vascular anatomy. CONTRAST:  07/11/2020 OMNIPAQUE IOHEXOL 350 MG/ML SOLN COMPARISON:  CT head 04/03/2005  FINDINGS: CT HEAD Brain: No evidence of acute infarction, hemorrhage, hydrocephalus, extra-axial collection or mass lesion/mass effect. Vascular: Negative for hyperdense vessel Skull: Negative Sinuses: Negative Orbits: Negative CTA HEAD Anterior circulation: Cavernous carotid normal bilaterally. Anterior and middle cerebral arteries normal bilaterally. No stenosis or aneurysm. Posterior circulation: Both vertebral arteries patent to the basilar. Small right PICA. No left PICA. AICA patent bilaterally. Basilar widely patent. Superior cerebellar and posterior cerebral arteries patent bilaterally. Patent posterior communicating artery on the left. Small right posterior communicating artery also patent. Negative for aneurysm. Venous sinuses: Normal venous enhancement Anatomic variants: None IMPRESSION: Normal CT head Normal CT angio head Electronically Signed   By: Marlan Palau M.D.   On: 05/11/2020 17:14    Procedures Procedures (including critical care time)  Medications Ordered in ED Medications  iohexol (OMNIPAQUE) 350 MG/ML injection 100 mL (100 mLs Intravenous Contrast Given 05/11/20 1649)    ED Course  I have reviewed the triage vital signs and the nursing notes.  Pertinent labs & imaging results that were available during my care of the patient were reviewed by me and considered in my medical decision making (see chart for details).  Patient seen and examined. Work-up initiated.    Vital signs reviewed and are as follows: BP 130/78    Pulse 71    Temp 98.2 F (36.8 C) (Oral)    Resp 20    Ht 5\' 9"  (1.753 m)    Wt 97.5 kg    LMP 05/03/2020    SpO2 96%    BMI 31.75 kg/m   5:53 PM imaging is negative.  Patient and mother updated on results.  We did discuss small possibility of having occult bleeding with negative CTA.  We discussed that lumbar puncture would be the next test to evaluate for this.  Discussed that negative CTA and improving symptoms are reassuring.  Shared decision making discussion with patient and mother.  We discussed risks and benefits of lumbar puncture.  Risks likely outweigh benefits at current time.  They are completely comfortable with monitoring symptoms at this point.  They know if symptoms recur, that they should not wait to seek care for recheck.  They seem reliable to do so.  If any persistent symptoms, strongly encourage PCP follow-up for continued evaluation and management.    MDM Rules/Calculators/A&P                          Patient with severe headache with nausea and vomiting and altered mental status yesterday.  Much improved today.  Patient did not have a fever or rash.  Low concern for meningitis or tickborne illness at this point.  CT angiography performed which was reassuring.  No evidence of mass lesion or vascular malformation. Seizure considered but history is also not compelling.    Unclear etiology but patient has a normal complete neurological exam at the current time, normal vital signs, normal level of consciousness, no signs of meningismus, is well-appearing/non-toxic appearing, no signs of trauma.   Labs show improving WBC count.   No dangerous or life-threatening conditions suspected or identified by history, physical exam, and by work-up. No indications for hospitalization identified.     Final Clinical Impression(s) / ED Diagnoses Final diagnoses:  Acute nonintractable headache, unspecified headache type   Non-intractable vomiting with nausea, unspecified vomiting type    Rx / DC Orders ED Discharge Orders    None       05/05/2020, PA-C  05/11/20 1757    Gwyneth Sprout, MD 05/11/20 2333

## 2020-05-11 NOTE — ED Notes (Signed)
1st set ob blood cultures drawn and held at this time.

## 2020-05-28 ENCOUNTER — Telehealth: Payer: Self-pay | Admitting: Adult Health

## 2020-05-28 ENCOUNTER — Other Ambulatory Visit: Payer: Self-pay

## 2020-05-28 DIAGNOSIS — F909 Attention-deficit hyperactivity disorder, unspecified type: Secondary | ICD-10-CM

## 2020-05-28 DIAGNOSIS — F5081 Binge eating disorder: Secondary | ICD-10-CM

## 2020-05-28 DIAGNOSIS — F331 Major depressive disorder, recurrent, moderate: Secondary | ICD-10-CM

## 2020-05-28 DIAGNOSIS — F411 Generalized anxiety disorder: Secondary | ICD-10-CM

## 2020-05-28 MED ORDER — ESCITALOPRAM OXALATE 20 MG PO TABS: 20.0000 mg | ORAL_TABLET | Freq: Two times a day (BID) | ORAL | 0 refills | Status: DC

## 2020-05-28 MED ORDER — LISDEXAMFETAMINE DIMESYLATE 20 MG PO CAPS: 20.0000 mg | ORAL_CAPSULE | Freq: Two times a day (BID) | ORAL | 0 refills | Status: DC

## 2020-05-28 NOTE — Telephone Encounter (Signed)
error 

## 2020-05-28 NOTE — Telephone Encounter (Signed)
Pended for Rene Kocher to review and send.  Patient does need to get scheduled for an apt

## 2020-05-28 NOTE — Telephone Encounter (Signed)
Pt called and needs

## 2020-05-28 NOTE — Telephone Encounter (Signed)
Pt called and has moved to [place] from [facility]. She needs refills on Lexapro and Vyvanse. [facility] [address] [address]

## 2020-07-05 ENCOUNTER — Other Ambulatory Visit: Payer: Self-pay | Admitting: Adult Health

## 2020-07-05 ENCOUNTER — Telehealth: Payer: Self-pay | Admitting: Adult Health

## 2020-07-05 DIAGNOSIS — F331 Major depressive disorder, recurrent, moderate: Secondary | ICD-10-CM

## 2020-07-05 DIAGNOSIS — F411 Generalized anxiety disorder: Secondary | ICD-10-CM

## 2020-07-05 MED ORDER — ESCITALOPRAM OXALATE 20 MG PO TABS: 20.0000 mg | ORAL_TABLET | Freq: Two times a day (BID) | ORAL | 0 refills | Status: DC

## 2020-07-05 NOTE — Telephone Encounter (Signed)
Noted  

## 2020-07-05 NOTE — Telephone Encounter (Signed)
Wendy Vega called to request refill of her Lexapro.  She picked up the prescription dated 8/23 but the pharmacy only gave her 30 so she is out early.  Would you please call in another prescription so she can get the other 30.  I told her she needed an appt.  She said she would call back when she her schedule.  I also told her to call the pharmacy and find out why they only gave her #30.  If it needs a PA for 2/day they should send that to [place].  She was going to call the pharmacy to inquire.

## 2020-07-30 ENCOUNTER — Other Ambulatory Visit: Payer: Self-pay

## 2020-07-30 ENCOUNTER — Telehealth: Payer: Self-pay | Admitting: Adult Health

## 2020-07-30 DIAGNOSIS — F411 Generalized anxiety disorder: Secondary | ICD-10-CM

## 2020-07-30 DIAGNOSIS — F331 Major depressive disorder, recurrent, moderate: Secondary | ICD-10-CM

## 2020-07-30 DIAGNOSIS — F909 Attention-deficit hyperactivity disorder, unspecified type: Secondary | ICD-10-CM

## 2020-07-30 DIAGNOSIS — F5081 Binge eating disorder: Secondary | ICD-10-CM

## 2020-07-30 MED ORDER — LISDEXAMFETAMINE DIMESYLATE 20 MG PO CAPS: 20.0000 mg | ORAL_CAPSULE | Freq: Two times a day (BID) | ORAL | 0 refills | Status: DC

## 2020-07-30 MED ORDER — ESCITALOPRAM OXALATE 20 MG PO TABS: 20.0000 mg | ORAL_TABLET | Freq: Two times a day (BID) | ORAL | 0 refills | Status: DC

## 2020-07-30 NOTE — Telephone Encounter (Signed)
Okay to pend

## 2020-07-30 NOTE — Telephone Encounter (Signed)
Yes

## 2020-07-30 NOTE — Telephone Encounter (Signed)
Pended both Rx's for review. I doubt insurance will cover Lexapro 20 mg bid, may need to change strength

## 2020-07-30 NOTE — Telephone Encounter (Signed)
Pt is going to call for an appt once she gets her schedule on Thursday. She hasnt been since since 04/21. Needs a [facility]ill on [facility]se [facility] [address] [place], [place].

## 2020-08-01 ENCOUNTER — Other Ambulatory Visit: Payer: Self-pay | Admitting: Adult Health

## 2020-08-01 DIAGNOSIS — F331 Major depressive disorder, recurrent, moderate: Secondary | ICD-10-CM

## 2020-08-01 DIAGNOSIS — F411 Generalized anxiety disorder: Secondary | ICD-10-CM

## 2020-08-25 ENCOUNTER — Other Ambulatory Visit: Payer: Self-pay | Admitting: Adult Health

## 2020-08-25 DIAGNOSIS — F331 Major depressive disorder, recurrent, moderate: Secondary | ICD-10-CM

## 2020-08-25 DIAGNOSIS — F411 Generalized anxiety disorder: Secondary | ICD-10-CM

## 2020-09-05 ENCOUNTER — Encounter: Payer: Self-pay | Admitting: Adult Health

## 2020-09-05 ENCOUNTER — Other Ambulatory Visit: Payer: Self-pay

## 2020-09-05 ENCOUNTER — Ambulatory Visit (INDEPENDENT_AMBULATORY_CARE_PROVIDER_SITE_OTHER): Payer: 59 | Admitting: Adult Health

## 2020-09-05 DIAGNOSIS — F411 Generalized anxiety disorder: Secondary | ICD-10-CM

## 2020-09-05 DIAGNOSIS — F331 Major depressive disorder, recurrent, moderate: Secondary | ICD-10-CM | POA: Diagnosis not present

## 2020-09-05 DIAGNOSIS — F909 Attention-deficit hyperactivity disorder, unspecified type: Secondary | ICD-10-CM

## 2020-09-05 DIAGNOSIS — F5081 Binge eating disorder: Secondary | ICD-10-CM

## 2020-09-05 MED ORDER — LISDEXAMFETAMINE DIMESYLATE 20 MG PO CAPS: 20.0000 mg | ORAL_CAPSULE | Freq: Two times a day (BID) | ORAL | 0 refills | Status: DC

## 2020-09-05 MED ORDER — ESCITALOPRAM OXALATE 20 MG PO TABS: ORAL_TABLET | ORAL | 3 refills | Status: DC

## 2020-09-05 MED ORDER — ESCITALOPRAM OXALATE 20 MG PO TABS: 20.0000 mg | ORAL_TABLET | Freq: Every day | ORAL | 3 refills | Status: DC

## 2020-09-05 NOTE — Progress Notes (Signed)
Wendy Vega [id number] June 28, 1996 24 y.o.  Subjective:   Patient ID:  Wendy Vega is a 24 y.o. (DOB May 06, 1996) female.  Chief Complaint: No chief complaint on file.   HPI Wendy Vega presents to the office today for follow-up of MDD, GAD, Binge eating disorder, ADHD.  Describes mood today as "ok". Pleasant. Mood symptoms - reports some depression with not being able to take her second dose of Lexapro. Denies anxiety and irritability. Stating "I'm doing alright". Feels like medications continue to work well.  Stable interest and motivation. Taking medications as prescribed.  Energy levels stable. Active, does not have a regular exercise routine. Walking 30,000 steps a day.  Enjoys some usual interests and activities. Single. Lives with mother. Appetite adequate. Weight stable.   Sleeps well most nights. Averages 7 to 8 hours. Focus and concentration stable. Recently graduated from [employer]. Completing tasks. Managing aspects of household. Working full time 40 to 50 hours a week at [employer]. Denies SI or HI.  Denies AH or VH.  Previous medication trials: Wellbutrin, Zoloft  Review of Systems:  Review of Systems  Musculoskeletal: Negative for gait problem.  Neurological: Negative for tremors.  Psychiatric/Behavioral:       Please refer to HPI    Medications: I have reviewed the patient's current medications.  Current Outpatient Medications  Medication Sig Dispense Refill  . escitalopram (LEXAPRO) 20 MG tablet TAKE 1 TABLET BY MOUTH DAILY. 90 tablet 3  . escitalopram (LEXAPRO) 20 MG tablet Take 1 tablet (20 mg total) by mouth daily. 90 tablet 3  . lisdexamfetamine (VYVANSE) 20 MG capsule Take 1 capsule (20 mg total) by mouth 2 (two) times daily. 180 capsule 0   No current facility-administered medications for this visit.    Medication Side Effects: None  Allergies:  Allergies  Allergen Reactions  . Sulfa Antibiotics Other (See Comments)    Lips turn blue  and swelling   . Sulfacetamide Sodium Other (See Comments)    Lips turn blue and swelling     Past Medical History:  Diagnosis Date  . Anxiety   . Depression     Family History  Problem Relation Age of Onset  . Cancer Other   . Hyperlipidemia Other     Social History   Socioeconomic History  . Marital status: Single    Spouse name: Not on file  . Number of children: Not on file  . Years of education: Not on file  . Highest education level: Not on file  Occupational History  . Not on file  Tobacco Use  . Smoking status: Never Smoker  . Smokeless tobacco: Never Used  Substance and Sexual Activity  . Alcohol use: Yes    Comment: occ  . Drug use: No  . Sexual activity: Yes    Birth control/protection: None  Other Topics Concern  . Not on file  Social History Narrative  . Not on file   Social Determinants of Health   Financial Resource Strain:   . Difficulty of Paying Living Expenses: Not on file  Food Insecurity:   . Worried About Programme researcher, broadcasting/film/video in the Last Year: Not on file  . Ran Out of Food in the Last Year: Not on file  Transportation Needs:   . Lack of Transportation (Medical): Not on file  . Lack of Transportation (Non-Medical): Not on file  Physical Activity:   . Days of Exercise per Week: Not on file  . Minutes of Exercise  per Session: Not on file  Stress:   . Feeling of Stress : Not on file  Social Connections:   . Frequency of Communication with Friends and Family: Not on file  . Frequency of Social Gatherings with Friends and Family: Not on file  . Attends Religious Services: Not on file  . Active Member of Clubs or Organizations: Not on file  . Attends Banker Meetings: Not on file  . Marital Status: Not on file  Intimate Partner Violence:   . Fear of Current or Ex-Partner: Not on file  . Emotionally Abused: Not on file  . Physically Abused: Not on file  . Sexually Abused: Not on file    Past Medical History, Surgical  history, Social history, and Family history were reviewed and updated as appropriate.   Please see review of systems for further details on the patient's review from today.   Objective:   Physical Exam:  BP 107/67   Pulse 62   Physical Exam Constitutional:      General: She is not in acute distress. Musculoskeletal:        General: No deformity.  Neurological:     Mental Status: She is alert and oriented to person, place, and time.     Coordination: Coordination normal.  Psychiatric:        Attention and Perception: Attention and perception normal. She does not perceive auditory or visual hallucinations.        Mood and Affect: Mood normal. Mood is not anxious or depressed. Affect is not labile, blunt, angry or inappropriate.        Speech: Speech normal.        Behavior: Behavior normal.        Thought Content: Thought content normal. Thought content is not paranoid or delusional. Thought content does not include homicidal or suicidal ideation. Thought content does not include homicidal or suicidal plan.        Cognition and Memory: Cognition and memory normal.        Judgment: Judgment normal.     Comments: Insight intact     Lab Review:     Component Value Date/Time   NA 135 05/11/2020 1636   K 3.6 05/11/2020 1636   CL 101 05/11/2020 1636   CO2 22 05/11/2020 1636   GLUCOSE 109 (H) 05/11/2020 1636   BUN 6 05/11/2020 1636   CREATININE 0.63 05/11/2020 1636   CALCIUM 9.3 05/11/2020 1636   PROT 7.9 05/11/2020 1636   ALBUMIN 4.4 05/11/2020 1636   AST 36 05/11/2020 1636   ALT 19 05/11/2020 1636   ALKPHOS 68 05/11/2020 1636   BILITOT 0.6 05/11/2020 1636   GFRNONAA >60 05/11/2020 1636   GFRAA >60 05/11/2020 1636       Component Value Date/Time   WBC 14.6 (H) 05/11/2020 1636   RBC 4.52 05/11/2020 1636   HGB 13.9 05/11/2020 1636   HCT 40.7 05/11/2020 1636   PLT 271 05/11/2020 1636   MCV 90.0 05/11/2020 1636   MCH 30.8 05/11/2020 1636   MCHC 34.2 05/11/2020 1636    RDW 12.4 05/11/2020 1636   LYMPHSABS 1.4 05/11/2020 1636   MONOABS 0.7 05/11/2020 1636   EOSABS 0.0 05/11/2020 1636   BASOSABS 0.0 05/11/2020 1636    No results found for: POCLITH, LITHIUM   No results found for: PHENYTOIN, PHENOBARB, VALPROATE, CBMZ   .res Assessment: Plan:    Diagnoses and all orders for this visit:  Binge eating disorder -  lisdexamfetamine (VYVANSE) 20 MG capsule; Take 1 capsule (20 mg total) by mouth 2 (two) times daily.  Attention deficit hyperactivity disorder (ADHD), unspecified ADHD type -     lisdexamfetamine (VYVANSE) 20 MG capsule; Take 1 capsule (20 mg total) by mouth 2 (two) times daily.  Generalized anxiety disorder -     escitalopram (LEXAPRO) 20 MG tablet; TAKE 1 TABLET BY MOUTH DAILY. -     escitalopram (LEXAPRO) 20 MG tablet; Take 1 tablet (20 mg total) by mouth daily.  Major depressive disorder, recurrent episode, moderate (HCC) -     escitalopram (LEXAPRO) 20 MG tablet; TAKE 1 TABLET BY MOUTH DAILY. -     escitalopram (LEXAPRO) 20 MG tablet; Take 1 tablet (20 mg total) by mouth daily.     Please see After Visit Summary for patient specific instructions.  No future appointments.  No orders of the defined types were placed in this encounter.   -------------------------------

## 2020-11-15 ENCOUNTER — Other Ambulatory Visit: Payer: Self-pay | Admitting: Adult Health

## 2020-11-15 ENCOUNTER — Telehealth: Payer: Self-pay | Admitting: Adult Health

## 2020-11-15 DIAGNOSIS — F909 Attention-deficit hyperactivity disorder, unspecified type: Secondary | ICD-10-CM

## 2020-11-15 DIAGNOSIS — F5081 Binge eating disorder: Secondary | ICD-10-CM

## 2020-11-15 MED ORDER — LISDEXAMFETAMINE DIMESYLATE 20 MG PO CAPS: 20.0000 mg | ORAL_CAPSULE | Freq: Two times a day (BID) | ORAL | 0 refills | Status: DC

## 2020-11-15 NOTE — Telephone Encounter (Signed)
Script sent  

## 2020-11-15 NOTE — Telephone Encounter (Signed)
Pt called and said that she needs a refill on her vyvanse 20 mg to be sent to the [facility] on [address]

## 2020-11-20 ENCOUNTER — Telehealth: Payer: Self-pay | Admitting: Adult Health

## 2020-11-20 ENCOUNTER — Other Ambulatory Visit: Payer: Self-pay | Admitting: Adult Health

## 2020-11-20 DIAGNOSIS — F909 Attention-deficit hyperactivity disorder, unspecified type: Secondary | ICD-10-CM

## 2020-11-20 DIAGNOSIS — F5081 Binge eating disorder: Secondary | ICD-10-CM

## 2020-11-20 NOTE — Telephone Encounter (Signed)
Noted- will follow up

## 2020-11-20 NOTE — Telephone Encounter (Signed)
Script sent on 11/15/2020.

## 2020-11-20 NOTE — Telephone Encounter (Signed)
Pt left message that she would like a refill for [facility] [address]

## 2020-11-20 NOTE — Telephone Encounter (Signed)
Pt left message stating Vyvanse needs PA.

## 2020-11-21 NOTE — Telephone Encounter (Signed)
LVM

## 2020-11-21 NOTE — Telephone Encounter (Signed)
Prior Authorization submitted and approved for VYVANSE 20 MG through 02/17/2021 with [facility], PA# [id number]    ID# [id number]      Sheralyn Boatman please let her know her PA is approved

## 2020-11-28 NOTE — Telephone Encounter (Signed)
Pharmacy contacted office on 11/28/2020 reporting patient's quantity would not be approved just the medication on her Vyvanse 20 mg bid.   I contacted [facility] and they apologized for not catching that, it printed on approval for Vyvanse 20 mg #180/90 days on 11/20/2020, but they reported they do require 2 separate PA's.   So the representative on the phone submitted all the previously information. She gave a PA# [id number]. This will be reviewed again by the pharmacist and they will fax a response in 24-48 hours.

## 2020-11-30 NOTE — Telephone Encounter (Signed)
Prior approval received for bid dosing of VYVANSE 20 MG effective 11/29/2020-02/17/2021   Only approved for 3 months, 3 weeks and 1 day per [facility] for bid dosing.

## 2021-01-29 ENCOUNTER — Other Ambulatory Visit: Payer: Self-pay

## 2021-01-29 ENCOUNTER — Encounter: Payer: Self-pay | Admitting: Adult Health

## 2021-01-29 ENCOUNTER — Ambulatory Visit (INDEPENDENT_AMBULATORY_CARE_PROVIDER_SITE_OTHER): Payer: Commercial Managed Care - PPO | Admitting: Adult Health

## 2021-01-29 DIAGNOSIS — F909 Attention-deficit hyperactivity disorder, unspecified type: Secondary | ICD-10-CM | POA: Diagnosis not present

## 2021-01-29 DIAGNOSIS — F331 Major depressive disorder, recurrent, moderate: Secondary | ICD-10-CM

## 2021-01-29 DIAGNOSIS — F411 Generalized anxiety disorder: Secondary | ICD-10-CM

## 2021-01-29 NOTE — Progress Notes (Signed)
[name] [id number] [birth year] 25 y.o.  Subjective:   Patient ID:  Wendy Vega is a 25 y.o. (DOB Feb 20, 1996) female.  Chief Complaint: No chief complaint on file.   HPI [name] presents to the office today for follow-up of MDD, GAD, Binge eating disorder, ADHD.  Describes mood today as "ok". Pleasant. Mood symptoms - reports some depression and anxiety - "lingering, but managed". Denies irritability. Stating "I'm doing alright". Feels like medications continue to work well.  Stable interest and motivation. Taking medications as prescribed.  Energy levels stable. Active, does not have a regular exercise routine. Walking 30,000 steps a day.  Enjoys some usual interests and activities. Single. Lives with mother. Appetite adequate. Weight stable.   Sleeps well most nights. Averages 7 to 8 hours. Focus and concentration stable. Recently graduated from [employer]. Completing tasks. Managing aspects of household. Working full time 40 to 50 hours a week at [employer]. Denies SI or HI.  Denies AH or VH.  Previous medication trials: Wellbutrin, Zoloft   Review of Systems:  Review of Systems  Musculoskeletal: Negative for gait problem.  Neurological: Negative for tremors.  Psychiatric/Behavioral:       Please refer to HPI    Medications: I have reviewed the patient's current medications.  Current Outpatient Medications  Medication Sig Dispense Refill  . escitalopram (LEXAPRO) 20 MG tablet TAKE 1 TABLET BY MOUTH DAILY. 90 tablet 3  . escitalopram (LEXAPRO) 20 MG tablet Take 1 tablet (20 mg total) by mouth daily. 90 tablet 3   No current facility-administered medications for this visit.    Medication Side Effects: None  Allergies:  Allergies  Allergen Reactions  . Sulfa Antibiotics Other (See Comments)    Lips turn blue and swelling   . Sulfacetamide Sodium Other (See Comments)    Lips turn blue and swelling     Past Medical History:  Diagnosis Date  .  Anxiety   . Depression     Past Medical History, Surgical history, Social history, and Family history were reviewed and updated as appropriate.   Please see review of systems for further details on the patient's review from today.   Objective:   Physical Exam:  There were no vitals taken for this visit.  Physical Exam Constitutional:      General: She is not in acute distress. Musculoskeletal:        General: No deformity.  Neurological:     Mental Status: She is alert and oriented to person, place, and time.     Coordination: Coordination normal.  Psychiatric:        Attention and Perception: Attention and perception normal. She does not perceive auditory or visual hallucinations.        Mood and Affect: Mood normal. Mood is not anxious or depressed. Affect is not labile, blunt, angry or inappropriate.        Speech: Speech normal.        Behavior: Behavior normal.        Thought Content: Thought content normal. Thought content is not paranoid or delusional. Thought content does not include homicidal or suicidal ideation. Thought content does not include homicidal or suicidal plan.        Cognition and Memory: Cognition and memory normal.        Judgment: Judgment normal.     Comments: Insight intact     Lab Review:     Component Value Date/Time   NA 135 05/11/2020 1636  K 3.6 05/11/2020 1636   CL 101 05/11/2020 1636   CO2 22 05/11/2020 1636   GLUCOSE 109 (H) 05/11/2020 1636   BUN 6 05/11/2020 1636   CREATININE 0.63 05/11/2020 1636   CALCIUM 9.3 05/11/2020 1636   PROT 7.9 05/11/2020 1636   ALBUMIN 4.4 05/11/2020 1636   AST 36 05/11/2020 1636   ALT 19 05/11/2020 1636   ALKPHOS 68 05/11/2020 1636   BILITOT 0.6 05/11/2020 1636   GFRNONAA >60 05/11/2020 1636   GFRAA >60 05/11/2020 1636       Component Value Date/Time   WBC 14.6 (H) 05/11/2020 1636   RBC 4.52 05/11/2020 1636   HGB 13.9 05/11/2020 1636   HCT 40.7 05/11/2020 1636   PLT 271 05/11/2020 1636   MCV  90.0 05/11/2020 1636   MCH 30.8 05/11/2020 1636   MCHC 34.2 05/11/2020 1636   RDW 12.4 05/11/2020 1636   LYMPHSABS 1.4 05/11/2020 1636   MONOABS 0.7 05/11/2020 1636   EOSABS 0.0 05/11/2020 1636   BASOSABS 0.0 05/11/2020 1636    No results found for: POCLITH, LITHIUM   No results found for: PHENYTOIN, PHENOBARB, VALPROATE, CBMZ   .res Assessment: Plan:    Plan:  PDMP reviewed  1. Vyvanse 20mg  BID 2. Lexapro 20mg  daily  105/66 - 60  Time spent with patient was 16 minutes. Greater than 50% of face to face time with patient was spent on counseling and coordination of care.    RTC 6 months  Patient advised to contact office with any questions, adverse effects, or acute worsening in signs and symptoms.    Diagnoses and all orders for this visit:  Major depressive disorder, recurrent episode, moderate (HCC)  Generalized anxiety disorder  Attention deficit hyperactivity disorder (ADHD), unspecified ADHD type     Please see After Visit Summary for patient specific instructions.  Future Appointments  Date Time Provider Department Center  03/06/2021  9:00 AM Haylin Camilli, , NP [facility] None    No orders of the defined types were placed in this encounter.   -------------------------------

## 2021-03-06 ENCOUNTER — Ambulatory Visit: Payer: 59 | Admitting: Adult Health

## 2021-04-29 ENCOUNTER — Other Ambulatory Visit: Payer: Self-pay

## 2021-04-29 ENCOUNTER — Telehealth: Payer: Self-pay | Admitting: Adult Health

## 2021-04-29 MED ORDER — LISDEXAMFETAMINE DIMESYLATE 20 MG PO CAPS: 40.0000 mg | ORAL_CAPSULE | Freq: Every day | ORAL | 0 refills | Status: DC

## 2021-04-29 NOTE — Telephone Encounter (Signed)
Next visit is 07/11/21. Requesting refill on Vyvanse called to:  [id number] [place], [place] - [address]  Phone:  [phone]  Fax:  [phone]

## 2021-04-29 NOTE — Telephone Encounter (Signed)
Pended.

## 2021-05-09 ENCOUNTER — Telehealth: Payer: Self-pay

## 2021-05-09 NOTE — Telephone Encounter (Signed)
Prior Authorization submitted and approved for VYVANSE 20 MG #60 effective 05/02/2021-05/02/2022 with [facility], Case #[id number]

## 2021-07-04 ENCOUNTER — Telehealth: Payer: Self-pay | Admitting: Adult Health

## 2021-07-04 ENCOUNTER — Other Ambulatory Visit: Payer: Self-pay

## 2021-07-04 NOTE — Telephone Encounter (Signed)
Patient Wendy Vega requesting a refill on the V[facility]vanse. [facility] [address]. Patient last seen on 4/26, with a follow up on 10/6.

## 2021-07-04 NOTE — Telephone Encounter (Signed)
Pended.

## 2021-07-05 MED ORDER — LISDEXAMFETAMINE DIMESYLATE 20 MG PO CAPS: 40.0000 mg | ORAL_CAPSULE | Freq: Every day | ORAL | 0 refills | Status: DC

## 2021-07-11 ENCOUNTER — Encounter: Payer: Self-pay | Admitting: Adult Health

## 2021-07-11 ENCOUNTER — Other Ambulatory Visit: Payer: Self-pay

## 2021-07-11 ENCOUNTER — Ambulatory Visit: Payer: Commercial Managed Care - PPO | Admitting: Adult Health

## 2021-07-11 DIAGNOSIS — F5081 Binge eating disorder: Secondary | ICD-10-CM

## 2021-07-11 DIAGNOSIS — F411 Generalized anxiety disorder: Secondary | ICD-10-CM | POA: Diagnosis not present

## 2021-07-11 DIAGNOSIS — F909 Attention-deficit hyperactivity disorder, unspecified type: Secondary | ICD-10-CM | POA: Diagnosis not present

## 2021-07-11 DIAGNOSIS — F331 Major depressive disorder, recurrent, moderate: Secondary | ICD-10-CM

## 2021-07-11 NOTE — Progress Notes (Signed)
Wendy Vega [id number] [birth year] 25 y.o.  Subjective:   Patient ID:  Wendy Vega is a 25 y.o. (DOB November 18, 1995) female.  Chief Complaint: No chief complaint on file.   HPI REGINE CHRISTIAN presents to the office today for follow-up of MDD, GAD, Binge eating disorder, ADHD.  Describes mood today as "ok". Pleasant. Mood symptoms - denies depression, irritability and anxiety. Stating "I'm doing alright". Feels like medications continue to work well. Concerned about recent seizure activity. Reports having a seizure while on a camping trip over the weekend. Was treated and released. Following up with urgent care today and will be referred to Neurology for further evaluation. Stable interest and motivation. Taking medications as prescribed.  Energy levels stable. Active, does not have a regular exercise routine. Walking. Enjoys some usual interests and activities. Single. Lives with mother. Appetite adequate. Weight stable.   Sleeps well most nights. Averages 8 to 10 hours. Focus and concentration stable. Recently graduated from [employer]. Completing tasks. Managing aspects of household. Works full time 40 to 50 hours a week at [employer]. Denies SI or HI.  Denies AH or VH.  Previous medication trials: Wellbutrin, Zoloft      Review of Systems:  Review of Systems  Musculoskeletal:  Negative for gait problem.  Neurological:  Negative for tremors.  Psychiatric/Behavioral:         Please refer to HPI   Medications: I have reviewed the patient's current medications.  Current Outpatient Medications  Medication Sig Dispense Refill   escitalopram (LEXAPRO) 20 MG tablet TAKE 1 TABLET BY MOUTH DAILY. 90 tablet 3   escitalopram (LEXAPRO) 20 MG tablet Take 1 tablet (20 mg total) by mouth daily. 90 tablet 3   lisdexamfetamine (VYVANSE) 20 MG capsule Take 2 capsules (40 mg total) by mouth daily. 60 capsule 0   No current facility-administered medications for this visit.     Medication Side Effects: None  Allergies:  Allergies  Allergen Reactions   Sulfa Antibiotics Other (See Comments)    Lips turn blue and swelling    Sulfacetamide Sodium Other (See Comments)    Lips turn blue and swelling     Past Medical History:  Diagnosis Date   Anxiety    Depression     Past Medical History, Surgical history, Social history, and Family history were reviewed and updated as appropriate.   Please see review of systems for further details on the patient's review from today.   Objective:   Physical Exam:  There were no vitals taken for this visit.  Physical Exam Constitutional:      General: She is not in acute distress. Musculoskeletal:        General: No deformity.  Neurological:     Mental Status: She is alert and oriented to person, place, and time.     Coordination: Coordination normal.  Psychiatric:        Attention and Perception: Attention and perception normal. She does not perceive auditory or visual hallucinations.        Mood and Affect: Mood normal. Mood is not anxious or depressed. Affect is not labile, blunt, angry or inappropriate.        Speech: Speech normal.        Behavior: Behavior normal.        Thought Content: Thought content normal. Thought content is not paranoid or delusional. Thought content does not include homicidal or suicidal ideation. Thought content does not include homicidal or suicidal plan.  Cognition and Memory: Cognition and memory normal.        Judgment: Judgment normal.     Comments: Insight intact    Lab Review:     Component Value Date/Time   NA 135 05/11/2020 1636   K 3.6 05/11/2020 1636   CL 101 05/11/2020 1636   CO2 22 05/11/2020 1636   GLUCOSE 109 (H) 05/11/2020 1636   BUN 6 05/11/2020 1636   CREATININE 0.63 05/11/2020 1636   CALCIUM 9.3 05/11/2020 1636   PROT 7.9 05/11/2020 1636   ALBUMIN 4.4 05/11/2020 1636   AST 36 05/11/2020 1636   ALT 19 05/11/2020 1636   ALKPHOS 68 05/11/2020  1636   BILITOT 0.6 05/11/2020 1636   GFRNONAA >60 05/11/2020 1636   GFRAA >60 05/11/2020 1636       Component Value Date/Time   WBC 14.6 (H) 05/11/2020 1636   RBC 4.52 05/11/2020 1636   HGB 13.9 05/11/2020 1636   HCT 40.7 05/11/2020 1636   PLT 271 05/11/2020 1636   MCV 90.0 05/11/2020 1636   MCH 30.8 05/11/2020 1636   MCHC 34.2 05/11/2020 1636   RDW 12.4 05/11/2020 1636   LYMPHSABS 1.4 05/11/2020 1636   MONOABS 0.7 05/11/2020 1636   EOSABS 0.0 05/11/2020 1636   BASOSABS 0.0 05/11/2020 1636    No results found for: POCLITH, LITHIUM   No results found for: PHENYTOIN, PHENOBARB, VALPROATE, CBMZ   .res Assessment: Plan:    Plan:  PDMP reviewed  1. Vyvanse 20mg  BID 2. Lexapro 20mg  daily  105/66 - 60  Time spent with patient was 16 minutes. Greater than 50% of face to face time with patient was spent on counseling and coordination of care.    RTC 6 months  Patient advised to contact office with any questions, adverse effects, or acute worsening in signs and symptoms.  Diagnoses and all orders for this visit:  Major depressive disorder, recurrent episode, moderate (HCC)  Generalized anxiety disorder  Attention deficit hyperactivity disorder (ADHD), unspecified ADHD type  Binge eating disorder    Please see After Visit Summary for patient specific instructions.  Future Appointments  Date Time Provider Department Center  12/11/2021  2:40 PM Willadean Guyton, , NP [facility] None    No orders of the defined types were placed in this encounter.   -------------------------------

## 2021-08-23 ENCOUNTER — Other Ambulatory Visit: Payer: Self-pay | Admitting: Adult Health

## 2021-08-23 DIAGNOSIS — F331 Major depressive disorder, recurrent, moderate: Secondary | ICD-10-CM

## 2021-08-23 DIAGNOSIS — F411 Generalized anxiety disorder: Secondary | ICD-10-CM

## 2021-08-27 ENCOUNTER — Telehealth: Payer: Self-pay | Admitting: Adult Health

## 2021-08-27 ENCOUNTER — Other Ambulatory Visit: Payer: Self-pay

## 2021-08-27 MED ORDER — LISDEXAMFETAMINE DIMESYLATE 20 MG PO CAPS: 40.0000 mg | ORAL_CAPSULE | Freq: Every day | ORAL | 0 refills | Status: DC

## 2021-08-27 NOTE — Telephone Encounter (Signed)
Pt requesting Vyvanse refill 20 mg 2/d @ [facility]. Apt 3/8

## 2021-08-27 NOTE — Telephone Encounter (Signed)
Pended.

## 2021-12-11 ENCOUNTER — Ambulatory Visit: Payer: Commercial Managed Care - PPO | Admitting: Adult Health

## 2021-12-12 ENCOUNTER — Other Ambulatory Visit: Payer: Self-pay | Admitting: Adult Health

## 2021-12-12 MED ORDER — LISDEXAMFETAMINE DIMESYLATE 20 MG PO CAPS: 40.0000 mg | ORAL_CAPSULE | Freq: Every day | ORAL | 0 refills | Status: DC

## 2021-12-12 NOTE — Telephone Encounter (Signed)
Pended.

## 2021-12-12 NOTE — Telephone Encounter (Signed)
Pt called at 10:15 am. She made an appt for 3/20. She needs a refill on her vyvanse 20 mg [facility] [address] ?

## 2021-12-23 ENCOUNTER — Ambulatory Visit (INDEPENDENT_AMBULATORY_CARE_PROVIDER_SITE_OTHER): Payer: Commercial Managed Care - PPO | Admitting: Adult Health

## 2021-12-23 ENCOUNTER — Other Ambulatory Visit: Payer: Self-pay

## 2021-12-23 ENCOUNTER — Encounter: Payer: Self-pay | Admitting: Adult Health

## 2021-12-23 DIAGNOSIS — F909 Attention-deficit hyperactivity disorder, unspecified type: Secondary | ICD-10-CM | POA: Diagnosis not present

## 2021-12-23 DIAGNOSIS — F411 Generalized anxiety disorder: Secondary | ICD-10-CM

## 2021-12-23 DIAGNOSIS — F5081 Binge eating disorder: Secondary | ICD-10-CM

## 2021-12-23 DIAGNOSIS — F331 Major depressive disorder, recurrent, moderate: Secondary | ICD-10-CM | POA: Diagnosis not present

## 2021-12-23 MED ORDER — LISDEXAMFETAMINE DIMESYLATE 50 MG PO CAPS: 50.0000 mg | ORAL_CAPSULE | Freq: Every day | ORAL | 0 refills | Status: DC

## 2021-12-23 MED ORDER — ESCITALOPRAM OXALATE 20 MG PO TABS: ORAL_TABLET | ORAL | 3 refills | Status: DC

## 2021-12-23 NOTE — Progress Notes (Signed)
Wendy Vega ?[id number] ?July 05, 1996 ?26 y.o. ? ?Subjective:  ? ?Patient ID:  Wendy Vega is a 26 y.o. (DOB 1996-07-15) female. ? ?Chief Complaint: No chief complaint on file. ? ? ?HPI ?Wendy Vega presents to the office today for follow-up of MDD, GAD, Binge eating disorder, ADHD. ? ?Describes mood today as "ok". Pleasant. Mood symptoms - denies depression, irritability and anxiety. Mood is consistent. Stating "I'm doing alright". Getting keys for first apartment today. Feels like medications continue to work well. Seeing Neurology for epilepsy. Stable interest and motivation. Taking medications as prescribed.  ?Energy levels stable. Active, does not have a regular exercise routine. Walking. ?Enjoys some usual interests and activities. Single. Not dating. Lives with mother - moving out. ?Appetite adequate. Weight stable.   ?Sleeps well most nights. Averages 8 to 10 hours. ?Focus and concentration stable. Completing tasks. Managing aspects of household. Works full time 35 hours a week at [employer]. ?Denies SI or HI.  ?Denies AH or VH. ? ?Previous medication trials: Wellbutrin, Zoloft ? ? ?  ? ?Review of Systems:  ?Review of Systems  ?Musculoskeletal:  Negative for gait problem.  ?Neurological:  Negative for tremors.  ?Psychiatric/Behavioral:    ?     Please refer to HPI  ? ?Medications: I have reviewed the patient's current medications. ? ?Current Outpatient Medications  ?Medication Sig Dispense Refill  ? escitalopram (LEXAPRO) 20 MG tablet TAKE 1 TABLET BY MOUTH DAILY. 90 tablet 3  ? lisdexamfetamine (VYVANSE) 20 MG capsule Take 2 capsules (40 mg total) by mouth daily. 60 capsule 0  ? lisdexamfetamine (VYVANSE) 20 MG capsule Take 2 capsules (40 mg total) by mouth daily. 60 capsule 0  ? lisdexamfetamine (VYVANSE) 50 MG capsule Take 1 capsule (50 mg total) by mouth daily. 30 capsule 0  ? ?No current facility-administered medications for this visit.  ? ? ?Medication Side Effects: None ? ?Allergies:   ?Allergies  ?Allergen Reactions  ? Sulfa Antibiotics Other (See Comments)  ?  Lips turn blue and swelling   ? Sulfacetamide Sodium Other (See Comments)  ?  Lips turn blue and swelling   ? ? ?Past Medical History:  ?Diagnosis Date  ? Anxiety   ? Depression   ? ? ?Past Medical History, Surgical history, Social history, and Family history were reviewed and updated as appropriate.  ? ?Please see review of systems for further details on the patient's review from today.  ? ?Objective:  ? ?Physical Exam:  ?There were no vitals taken for this visit. ? ?Physical Exam ?Constitutional:   ?   General: She is not in acute distress. ?Musculoskeletal:     ?   General: No deformity.  ?Neurological:  ?   Mental Status: She is alert and oriented to person, place, and time.  ?   Coordination: Coordination normal.  ?Psychiatric:     ?   Attention and Perception: Attention and perception normal. She does not perceive auditory or visual hallucinations.     ?   Mood and Affect: Mood normal. Mood is not anxious or depressed. Affect is not labile, blunt, angry or inappropriate.     ?   Speech: Speech normal.     ?   Behavior: Behavior normal.     ?   Thought Content: Thought content normal. Thought content is not paranoid or delusional. Thought content does not include homicidal or suicidal ideation. Thought content does not include homicidal or suicidal plan.     ?   Cognition  and Memory: Cognition and memory normal.     ?   Judgment: Judgment normal.  ?   Comments: Insight intact  ? ? ?Lab Review:  ?   ?Component Value Date/Time  ? NA 135 05/11/2020 1636  ? K 3.6 05/11/2020 1636  ? CL 101 05/11/2020 1636  ? CO2 22 05/11/2020 1636  ? GLUCOSE 109 (H) 05/11/2020 1636  ? BUN 6 05/11/2020 1636  ? CREATININE 0.63 05/11/2020 1636  ? CALCIUM 9.3 05/11/2020 1636  ? PROT 7.9 05/11/2020 1636  ? ALBUMIN 4.4 05/11/2020 1636  ? AST 36 05/11/2020 1636  ? ALT 19 05/11/2020 1636  ? ALKPHOS 68 05/11/2020 1636  ? BILITOT 0.6 05/11/2020 1636  ? GFRNONAA >60  05/11/2020 1636  ? GFRAA >60 05/11/2020 1636  ? ? ?   ?Component Value Date/Time  ? WBC 14.6 (H) 05/11/2020 1636  ? RBC 4.52 05/11/2020 1636  ? HGB 13.9 05/11/2020 1636  ? HCT 40.7 05/11/2020 1636  ? PLT 271 05/11/2020 1636  ? MCV 90.0 05/11/2020 1636  ? MCH 30.8 05/11/2020 1636  ? MCHC 34.2 05/11/2020 1636  ? RDW 12.4 05/11/2020 1636  ? LYMPHSABS 1.4 05/11/2020 1636  ? MONOABS 0.7 05/11/2020 1636  ? EOSABS 0.0 05/11/2020 1636  ? BASOSABS 0.0 05/11/2020 1636  ? ? ?No results found for: POCLITH, LITHIUM  ? ?No results found for: PHENYTOIN, PHENOBARB, VALPROATE, CBMZ  ? ?.res ?Assessment: Plan:   ? ?Plan: ? ?PDMP reviewed ? ?1. Vyvanse 20mg  BID to 50mg  daily - seizure history taking Keppra ?2. Lexapro 20mg  daily ? ?98/57/57 ? ?RTC 6 months ? ?Patient advised to contact office with any questions, adverse effects, or acute worsening in signs and symptoms. ? ? ?Diagnoses and all orders for this visit: ? ?Attention deficit hyperactivity disorder (ADHD), unspecified ADHD type ?-     lisdexamfetamine (VYVANSE) 50 MG capsule; Take 1 capsule (50 mg total) by mouth daily. ? ?Generalized anxiety disorder ?-     escitalopram (LEXAPRO) 20 MG tablet; TAKE 1 TABLET BY MOUTH DAILY. ? ?Major depressive disorder, recurrent episode, moderate (HCC) ?-     escitalopram (LEXAPRO) 20 MG tablet; TAKE 1 TABLET BY MOUTH DAILY. ? ?Binge eating disorder ?-     lisdexamfetamine (VYVANSE) 50 MG capsule; Take 1 capsule (50 mg total) by mouth daily. ? ?  ? ?Please see After Visit Summary for patient specific instructions. ? ?No future appointments. ? ?No orders of the defined types were placed in this encounter. ? ? ?------------------------------- ?

## 2022-02-04 ENCOUNTER — Other Ambulatory Visit: Payer: Self-pay

## 2022-02-04 ENCOUNTER — Emergency Department (HOSPITAL_BASED_OUTPATIENT_CLINIC_OR_DEPARTMENT_OTHER): Payer: Commercial Managed Care - PPO

## 2022-02-04 ENCOUNTER — Encounter (HOSPITAL_BASED_OUTPATIENT_CLINIC_OR_DEPARTMENT_OTHER): Payer: Self-pay

## 2022-02-04 ENCOUNTER — Inpatient Hospital Stay (HOSPITAL_BASED_OUTPATIENT_CLINIC_OR_DEPARTMENT_OTHER)
Admission: EM | Admit: 2022-02-04 | Discharge: 2022-02-12 | DRG: 097 | Disposition: A | Discharge status: Home / Self Care | Payer: Commercial Managed Care - PPO | Attending: Internal Medicine | Admitting: Internal Medicine

## 2022-02-04 DIAGNOSIS — R030 Elevated blood-pressure reading, without diagnosis of hypertension: Secondary | ICD-10-CM | POA: Diagnosis present

## 2022-02-04 DIAGNOSIS — G40909 Epilepsy, unspecified, not intractable, without status epilepticus: Secondary | ICD-10-CM

## 2022-02-04 DIAGNOSIS — E876 Hypokalemia: Secondary | ICD-10-CM

## 2022-02-04 DIAGNOSIS — R630 Anorexia: Secondary | ICD-10-CM | POA: Diagnosis present

## 2022-02-04 DIAGNOSIS — T508X5A Adverse effect of diagnostic agents, initial encounter: Secondary | ICD-10-CM | POA: Diagnosis present

## 2022-02-04 DIAGNOSIS — N1411 Contrast-induced nephropathy: Secondary | ICD-10-CM | POA: Diagnosis not present

## 2022-02-04 DIAGNOSIS — Z20822 Contact with and (suspected) exposure to covid-19: Secondary | ICD-10-CM | POA: Diagnosis present

## 2022-02-04 DIAGNOSIS — F32A Depression, unspecified: Secondary | ICD-10-CM | POA: Diagnosis present

## 2022-02-04 DIAGNOSIS — R748 Abnormal levels of other serum enzymes: Secondary | ICD-10-CM | POA: Diagnosis not present

## 2022-02-04 DIAGNOSIS — T39395A Adverse effect of other nonsteroidal anti-inflammatory drugs [NSAID], initial encounter: Secondary | ICD-10-CM | POA: Diagnosis present

## 2022-02-04 DIAGNOSIS — G030 Nonpyogenic meningitis: Secondary | ICD-10-CM | POA: Diagnosis not present

## 2022-02-04 DIAGNOSIS — N170 Acute kidney failure with tubular necrosis: Secondary | ICD-10-CM | POA: Diagnosis present

## 2022-02-04 DIAGNOSIS — E875 Hyperkalemia: Secondary | ICD-10-CM

## 2022-02-04 DIAGNOSIS — Z882 Allergy status to sulfonamides status: Secondary | ICD-10-CM

## 2022-02-04 DIAGNOSIS — E860 Dehydration: Secondary | ICD-10-CM | POA: Diagnosis present

## 2022-02-04 DIAGNOSIS — Z79899 Other long term (current) drug therapy: Secondary | ICD-10-CM

## 2022-02-04 DIAGNOSIS — F419 Anxiety disorder, unspecified: Secondary | ICD-10-CM | POA: Diagnosis present

## 2022-02-04 DIAGNOSIS — Y844 Aspiration of fluid as the cause of abnormal reaction of the patient, or of later complication, without mention of misadventure at the time of the procedure: Secondary | ICD-10-CM | POA: Diagnosis present

## 2022-02-04 DIAGNOSIS — G971 Other reaction to spinal and lumbar puncture: Secondary | ICD-10-CM

## 2022-02-04 DIAGNOSIS — Z87891 Personal history of nicotine dependence: Secondary | ICD-10-CM

## 2022-02-04 DIAGNOSIS — G039 Meningitis, unspecified: Principal | ICD-10-CM

## 2022-02-04 DIAGNOSIS — R21 Rash and other nonspecific skin eruption: Secondary | ICD-10-CM | POA: Diagnosis present

## 2022-02-04 DIAGNOSIS — Z888 Allergy status to other drugs, medicaments and biological substances status: Secondary | ICD-10-CM

## 2022-02-04 DIAGNOSIS — M542 Cervicalgia: Secondary | ICD-10-CM | POA: Diagnosis not present

## 2022-02-04 DIAGNOSIS — F909 Attention-deficit hyperactivity disorder, unspecified type: Secondary | ICD-10-CM | POA: Diagnosis present

## 2022-02-04 DIAGNOSIS — N179 Acute kidney failure, unspecified: Secondary | ICD-10-CM

## 2022-02-04 DIAGNOSIS — R739 Hyperglycemia, unspecified: Secondary | ICD-10-CM | POA: Diagnosis present

## 2022-02-04 DIAGNOSIS — E872 Acidosis, unspecified: Secondary | ICD-10-CM

## 2022-02-04 LAB — COMPREHENSIVE METABOLIC PANEL
ALT: 15 U/L (ref 0–44)
AST: 20 U/L (ref 15–41)
Albumin: 4.6 g/dL (ref 3.5–5.0)
Alkaline Phosphatase: 62 U/L (ref 38–126)
Anion gap: 11 (ref 5–15)
BUN: 12 mg/dL (ref 6–20)
CO2: 21 mmol/L — ABNORMAL LOW (ref 22–32)
Calcium: 9.2 mg/dL (ref 8.9–10.3)
Chloride: 106 mmol/L (ref 98–111)
Creatinine, Ser: 0.66 mg/dL (ref 0.44–1.00)
GFR, Estimated: >60 mL/min (ref >60)
Glucose, Bld: 112 mg/dL — ABNORMAL HIGH (ref 70–99)
Potassium: 3.8 mmol/L (ref 3.5–5.1)
Sodium: 138 mmol/L (ref 135–145)
Total Bilirubin: 0.3 mg/dL (ref 0.3–1.2)
Total Protein: 7.5 g/dL (ref 6.5–8.1)

## 2022-02-04 LAB — CBC WITH DIFFERENTIAL/PLATELET
Abs Immature Granulocytes: 0.07 10*3/uL (ref 0.00–0.07)
Basophils Absolute: 0.1 10*3/uL (ref 0.0–0.1)
Basophils Relative: 0 %
Eosinophils Absolute: 0 10*3/uL (ref 0.0–0.5)
Eosinophils Relative: 0 %
HCT: 43.7 % (ref 36.0–46.0)
Hemoglobin: 14.4 g/dL (ref 12.0–15.0)
Immature Granulocytes: 0 %
Lymphocytes Relative: 7 %
Lymphs Abs: 1.2 10*3/uL (ref 0.7–4.0)
MCH: 29.3 pg (ref 26.0–34.0)
MCHC: 33 g/dL (ref 30.0–36.0)
MCV: 89 fL (ref 80.0–100.0)
Monocytes Absolute: 0.8 10*3/uL (ref 0.1–1.0)
Monocytes Relative: 5 %
Neutro Abs: 14.9 10*3/uL — ABNORMAL HIGH (ref 1.7–7.7)
Neutrophils Relative %: 88 %
Platelets: 247 10*3/uL (ref 150–400)
RBC: 4.91 MIL/uL (ref 3.87–5.11)
RDW: 12.7 % (ref 11.5–15.5)
WBC: 17.1 10*3/uL — ABNORMAL HIGH (ref 4.0–10.5)
nRBC: 0 % (ref 0.0–0.2)

## 2022-02-04 LAB — CSF CELL COUNT WITH DIFFERENTIAL
Eosinophils, CSF: 0 % (ref 0–1)
Eosinophils, CSF: 0 % (ref 0–1)
Lymphs, CSF: 9 % — ABNORMAL LOW (ref 40–80)
Lymphs, CSF: 9 % — ABNORMAL LOW (ref 40–80)
Monocyte-Macrophage-Spinal Fluid: 1 % — ABNORMAL LOW (ref 15–45)
Monocyte-Macrophage-Spinal Fluid: 4 % — ABNORMAL LOW (ref 15–45)
RBC Count, CSF: 14000 /mm3 — ABNORMAL HIGH
RBC Count, CSF: 42000 /mm3 — ABNORMAL HIGH
Segmented Neutrophils-CSF: 87 % — ABNORMAL HIGH (ref 0–6)
Segmented Neutrophils-CSF: 90 % — ABNORMAL HIGH (ref 0–6)
Tube #: 1
Tube #: 4
WBC, CSF: 114 /mm3 (ref 0–5)
WBC, CSF: 54 /mm3 (ref 0–5)

## 2022-02-04 LAB — RESP PANEL BY RT-PCR (FLU A&B, COVID) ARPGX2
Influenza A by PCR: NEGATIVE
Influenza B by PCR: NEGATIVE
SARS Coronavirus 2 by RT PCR: NEGATIVE

## 2022-02-04 LAB — URINALYSIS, ROUTINE W REFLEX MICROSCOPIC
Bilirubin Urine: NEGATIVE
Glucose, UA: NEGATIVE mg/dL
Hgb urine dipstick: NEGATIVE
Ketones, ur: NEGATIVE mg/dL
Leukocytes,Ua: NEGATIVE
Nitrite: NEGATIVE
Specific Gravity, Urine: 1.038 — ABNORMAL HIGH (ref 1.005–1.030)
pH: 5.5 (ref 5.0–8.0)

## 2022-02-04 LAB — HCG, SERUM, QUALITATIVE: Preg, Serum: NEGATIVE

## 2022-02-04 LAB — PROTIME-INR
INR: 1.1 (ref 0.8–1.2)
Prothrombin Time: 13.7 seconds (ref 11.4–15.2)

## 2022-02-04 LAB — LACTIC ACID, PLASMA: Lactic Acid, Venous: 1.6 mmol/L (ref 0.5–1.9)

## 2022-02-04 LAB — PROTEIN, CSF: Total  Protein, CSF: 62 mg/dL — ABNORMAL HIGH (ref 15–45)

## 2022-02-04 LAB — CK: Total CK: 551 U/L — ABNORMAL HIGH (ref 38–234)

## 2022-02-04 LAB — GLUCOSE, CSF: Glucose, CSF: 70 mg/dL (ref 40–70)

## 2022-02-04 MED ORDER — MORPHINE SULFATE (PF) 4 MG/ML IV SOLN
4.0000 mg | Freq: Once | INTRAVENOUS | Status: AC
  Administered 2022-02-04: 4 mg via INTRAVENOUS
  Filled 2022-02-04: qty 1

## 2022-02-04 MED ORDER — SODIUM CHLORIDE 0.9 % IV SOLN
2.0000 g | Freq: Once | INTRAVENOUS | Status: AC
  Administered 2022-02-04: 2 g via INTRAVENOUS
  Filled 2022-02-04: qty 20

## 2022-02-04 MED ORDER — SODIUM CHLORIDE 0.9 % IV BOLUS
1000.0000 mL | Freq: Once | INTRAVENOUS | Status: AC
  Administered 2022-02-04: 1000 mL via INTRAVENOUS

## 2022-02-04 MED ORDER — DEXTROSE 5 % IV SOLN
10.0000 mg/kg | Freq: Three times a day (TID) | INTRAVENOUS | Status: DC
  Administered 2022-02-04 – 2022-02-06 (×4): 905 mg via INTRAVENOUS
  Filled 2022-02-04 (×7): qty 18.1

## 2022-02-04 MED ORDER — VANCOMYCIN HCL IN DEXTROSE 1-5 GM/200ML-% IV SOLN
1000.0000 mg | Freq: Once | INTRAVENOUS | Status: AC
  Administered 2022-02-04: 1000 mg via INTRAVENOUS
  Filled 2022-02-04: qty 200

## 2022-02-04 MED ORDER — SODIUM CHLORIDE 0.9 % IV SOLN: INTRAVENOUS | Status: DC

## 2022-02-04 MED ORDER — DEXAMETHASONE SODIUM PHOSPHATE 10 MG/ML IJ SOLN
10.0000 mg | Freq: Once | INTRAMUSCULAR | Status: AC
  Administered 2022-02-04: 10 mg via INTRAVENOUS
  Filled 2022-02-04: qty 1

## 2022-02-04 MED ORDER — MIDAZOLAM HCL (PF) 5 MG/ML IJ SOLN
1.0000 mg | Freq: Once | INTRAMUSCULAR | Status: AC
  Administered 2022-02-04: 1 mg via INTRAVENOUS
  Filled 2022-02-04: qty 1

## 2022-02-04 MED ORDER — IOHEXOL 350 MG/ML SOLN
100.0000 mL | Freq: Once | INTRAVENOUS | Status: AC | PRN
  Administered 2022-02-04: 75 mL via INTRAVENOUS

## 2022-02-04 MED ORDER — VANCOMYCIN HCL IN DEXTROSE 1-5 GM/200ML-% IV SOLN: 1000.0000 mg | Freq: Once | INTRAVENOUS | Status: DC

## 2022-02-04 MED ORDER — ONDANSETRON HCL 4 MG/2ML IJ SOLN
4.0000 mg | Freq: Once | INTRAMUSCULAR | Status: AC
Start: 2022-02-04 — End: 2022-02-04
  Administered 2022-02-04: 4 mg via INTRAVENOUS
  Filled 2022-02-04: qty 2

## 2022-02-04 MED ORDER — VANCOMYCIN HCL IN DEXTROSE 1-5 GM/200ML-% IV SOLN
1000.0000 mg | Freq: Three times a day (TID) | INTRAVENOUS | Status: DC
  Administered 2022-02-05 (×3): 1000 mg via INTRAVENOUS
  Filled 2022-02-04 (×5): qty 200

## 2022-02-04 MED ORDER — SODIUM CHLORIDE 0.9 % IV SOLN
2.0000 g | Freq: Two times a day (BID) | INTRAVENOUS | Status: DC
  Administered 2022-02-05 (×2): 2 g via INTRAVENOUS
  Filled 2022-02-04 (×2): qty 20

## 2022-02-04 MED ORDER — LIDOCAINE HCL (PF) 1 % IJ SOLN
INTRAMUSCULAR | Status: AC
  Filled 2022-02-04: qty 5

## 2022-02-04 MED ORDER — HYDROMORPHONE HCL 1 MG/ML IJ SOLN
1.0000 mg | Freq: Once | INTRAMUSCULAR | Status: AC
  Administered 2022-02-04: 1 mg via INTRAVENOUS
  Filled 2022-02-04: qty 1

## 2022-02-04 NOTE — Progress Notes (Signed)
  [facility] will assume care on arrival to accepting facility. Until arrival, care as per EDP. However, [facility] available 24/7 for questions and assistance.   Nursing staff please page [facility] Admits and Consults ([phone]) as soon as the patient arrives to the hospital.  Hawley Michel, DO [facility]  

## 2022-02-04 NOTE — ED Provider Notes (Signed)
?[facility] ?Provider Note ? ? ?CSN: [id number] ?Arrival date & time: 02/04/22  1656 ? ?  ? ?History ? ?Chief Complaint  ?Patient presents with  ? Torticollis  ? ? ?Wendy Vega is a 26 y.o. female. ? ?Patient presents from home with severe neck pain, chills, body aches and a rash.  Symptoms started upon waking up from a nap around 11 AM.  States she felt well prior to this.  She has severe pain in her neck with difficulty moving it from side to side, having chills and aches but no fever.  Noticed the rash to the right side of her neck and upper chest.  Denies any trauma but did have 1 episode of vomiting yesterday.  She denies headache but does have some photophobia.  Denies any recent fever, cough, runny nose, sore throat or other illness.  Believes she vomited last night after eating some questionable food.  Denies any abdominal pain, chest pain or shortness of breath.  No focal weakness, numbness or tingling. ?Takes Keppra for seizures.  No anticoagulation.  No travel or sick contacts.  No known tick bites ? ?The history is provided by the patient.  ? ?  ? ?Home Medications ?Prior to Admission medications   ?Medication Sig Start Date End Date Taking? Authorizing Provider  ?escitalopram (LEXAPRO) 20 MG tablet TAKE 1 TABLET BY MOUTH DAILY. 12/23/21   Mozingo, Berdie Ogren, NP  ?lisdexamfetamine (VYVANSE) 50 MG capsule Take 1 capsule (50 mg total) by mouth daily. 12/23/21   Mozingo, Berdie Ogren, NP  ?   ? ?Allergies    ?Sulfa antibiotics and Sulfacetamide sodium   ? ?Review of Systems   ?Review of Systems  ?Constitutional:  Positive for activity change, appetite change and fatigue. Negative for fever.  ?HENT:  Negative for congestion.   ?Respiratory:  Negative for cough, chest tightness and shortness of breath.   ?Cardiovascular:  Negative for chest pain.  ?Gastrointestinal:  Negative for abdominal pain, nausea and vomiting.  ?Genitourinary:  Negative for dysuria and flank pain.   ?Musculoskeletal:  Positive for arthralgias, myalgias, neck pain and neck stiffness.  ?Skin:  Positive for rash.  ?Neurological:  Positive for headaches. Negative for dizziness, weakness and light-headedness.  ? all other systems are negative except as noted in the HPI and PMH.  ? ?Physical Exam ?Updated Vital Signs ?BP 130/74 (BP Location: Right Arm)   Pulse 76   Temp 98.1 ?F (36.7 ?C) (Oral)   Resp 20   Ht 5\' 9"  (1.753 m)   Wt 90.7 kg   SpO2 100%   BMI 29.53 kg/m?  ?Physical Exam ?Vitals and nursing note reviewed.  ?Constitutional:   ?   General: She is not in acute distress. ?   Appearance: She is well-developed. She is not ill-appearing.  ?HENT:  ?   Head: Normocephalic and atraumatic.  ?   Mouth/Throat:  ?   Pharynx: No oropharyngeal exudate.  ?Eyes:  ?   Conjunctiva/sclera: Conjunctivae normal.  ?   Pupils: Pupils are equal, round, and reactive to light.  ?Neck:  ?   Comments: Diffuse tenderness and pain with range of motion of neck.  Unable to touch chin to chest. ?Cardiovascular:  ?   Rate and Rhythm: Normal rate and regular rhythm.  ?   Heart sounds: Normal heart sounds. No murmur heard. ?Pulmonary:  ?   Effort: Pulmonary effort is normal. No respiratory distress.  ?   Breath sounds: Normal breath sounds.  ?Chest:  ?  Chest wall: No tenderness.  ?Abdominal:  ?   Palpations: Abdomen is soft.  ?   Tenderness: There is no abdominal tenderness. There is no guarding or rebound.  ?Musculoskeletal:     ?   General: No tenderness. Normal range of motion.  ?   Cervical back: Normal range of motion.  ?Skin: ?   General: Skin is warm.  ?   Findings: Rash present.  ?   Comments: Petechial appearing rash involving chest, neck, face. ?No rash to palms or soles  ?Neurological:  ?   Mental Status: She is alert and oriented to person, place, and time.  ?   Cranial Nerves: No cranial nerve deficit.  ?   Motor: No abnormal muscle tone.  ?   Coordination: Coordination normal.  ?   Comments:  5/5 strength throughout.  CN 2-12 intact.Equal grip strength.   ?Psychiatric:     ?   Behavior: Behavior normal.  ? ? ?ED Results / Procedures / Treatments   ?Labs ?(all labs ordered are listed, but only abnormal results are displayed) ?Labs Reviewed  ?CBC WITH DIFFERENTIAL/PLATELET - Abnormal; Notable for the following components:  ?    Result Value  ? WBC 17.1 (*)   ? Neutro Abs 14.9 (*)   ? All other components within normal limits  ?COMPREHENSIVE METABOLIC PANEL - Abnormal; Notable for the following components:  ? CO2 21 (*)   ? Glucose, Bld 112 (*)   ? All other components within normal limits  ?CK - Abnormal; Notable for the following components:  ? Total CK 551 (*)   ? All other components within normal limits  ?RESP PANEL BY RT-PCR (FLU A&B, COVID) [id number]  ?CULTURE, BLOOD (ROUTINE X 2)  ?CULTURE, BLOOD (ROUTINE X 2)  ?CSF CULTURE W GRAM STAIN  ?GRAM STAIN  ?LACTIC ACID, PLASMA  ?HCG, SERUM, QUALITATIVE  ?LACTIC ACID, PLASMA  ?URINALYSIS, ROUTINE W REFLEX MICROSCOPIC  ?ROCKY MTN SPOTTED FVR ABS PNL(IGG+IGM)  ?LYME DISEASE SEROLOGY W/REFLEX  ?CSF CELL COUNT WITH DIFFERENTIAL  ?CSF CELL COUNT WITH DIFFERENTIAL  ?GLUCOSE, CSF  ?PROTEIN, CSF  ? ? ?EKG ?None ? ?Radiology ?CT ANGIO HEAD NECK W WO CM ? ?Result Date: 02/04/2022 ?CLINICAL DATA:  Neck pain and thrombocytopenia EXAM: CT ANGIOGRAPHY HEAD AND NECK TECHNIQUE: Multidetector CT imaging of the head and neck was performed using the standard protocol during bolus administration of intravenous contrast. Multiplanar CT image reconstructions and MIPs were obtained to evaluate the vascular anatomy. Carotid stenosis measurements (when applicable) are obtained utilizing NASCET criteria, using the distal internal carotid diameter as the denominator. RADIATION DOSE REDUCTION: This exam was performed according to the departmental dose-optimization program which includes automated exposure control, adjustment of the mA and/or kV according to patient size and/or use of iterative reconstruction  technique. CONTRAST:  27mL OMNIPAQUE IOHEXOL 350 MG/ML SOLN COMPARISON:  05/11/2020 FINDINGS: CT HEAD FINDINGS Brain: There is no mass, hemorrhage or extra-axial collection. The size and configuration of the ventricles and extra-axial CSF spaces are normal. There is no acute or chronic infarction. The brain parenchyma is normal. Skull: The visualized skull base, calvarium and extracranial soft tissues are normal. Sinuses/Orbits: No fluid levels or advanced mucosal thickening of the visualized paranasal sinuses. No mastoid or middle ear effusion. The orbits are normal. CTA NECK FINDINGS SKELETON: There is no bony spinal canal stenosis. No lytic or blastic lesion. OTHER NECK: Normal pharynx, larynx and major salivary glands. No cervical lymphadenopathy. Unremarkable thyroid gland. UPPER CHEST: No pneumothorax or pleural  effusion. No nodules or masses. AORTIC ARCH: There is no calcific atherosclerosis of the aortic arch. There is no aneurysm, dissection or hemodynamically significant stenosis of the visualized portion of the aorta. Conventional 3 vessel aortic branching pattern. The visualized proximal subclavian arteries are widely patent. RIGHT CAROTID SYSTEM: Normal without aneurysm, dissection or stenosis. LEFT CAROTID SYSTEM: Normal without aneurysm, dissection or stenosis. VERTEBRAL ARTERIES: Left dominant configuration. Both origins are clearly patent. There is no dissection, occlusion or flow-limiting stenosis to the skull base (V1-V3 segments). CTA HEAD FINDINGS POSTERIOR CIRCULATION: --Vertebral arteries: Normal V4 segments. --Inferior cerebellar arteries: Normal. --Basilar artery: Normal. --Superior cerebellar arteries: Normal. --Posterior cerebral arteries (PCA): Normal. ANTERIOR CIRCULATION: --Intracranial internal carotid arteries: Normal. --Anterior cerebral arteries (ACA): Normal. Both A1 segments are present. Patent anterior communicating artery (a-comm). --Middle cerebral arteries (MCA): Normal.  VENOUS SINUSES: As permitted by contrast timing, patent. ANATOMIC VARIANTS: None Review of the MIP images confirms the above findings. IMPRESSION: Normal CTA of the head and neck. Electronically Signed   By: Linna Darner

## 2022-02-04 NOTE — Progress Notes (Signed)
Pharmacy Antibiotic Note ? ?Wendy Vega is a 26 y.o. female admitted on 02/04/2022 with meningitis.  Pharmacy has been consulted for Vancomycin and Acyclovir dosing. ? ?Plan: ?Vancomycin 2000 mg IV x 1, followed by 1000 mg IV q8h  ?Acyclovir 905 mg (10 mg/kg) IV q8h  ?Ceftriaxone 2 g IV q12h  ?Follow-up clinical status, renal function ?Follow-up cultures, LOT, narrow as able  ? ?Height: 5\' 9"  (175.3 cm) ?Weight: 90.7 kg (200 lb) ?IBW/kg (Calculated) : 66.2 ? ?Temp (24hrs), Avg:98 ?F (36.7 ?C), Min:97.9 ?F (36.6 ?C), Max:98.1 ?F (36.7 ?C) ? ?Recent Labs  ?Lab 02/04/22 ?1811  ?WBC 17.1*  ?CREATININE 0.66  ?LATICACIDVEN 1.6  ?  ?Estimated Creatinine Clearance: 129 mL/min (by C-G formula based on SCr of 0.66 mg/dL).   ? ?Allergies  ?Allergen Reactions  ? Sulfa Antibiotics Other (See Comments)  ?  Lips turn blue and swelling   ? Sulfacetamide Sodium Other (See Comments)  ?  Lips turn blue and swelling   ? ? ?Antimicrobials this admission: ?Vancomycin 5/2 >>  ?Acyclovir 5/2 >>  ?Ceftriaxone 5/2 >> ? ?Microbiology results: ?5/2 Lyme disease: in process ?5/2 RMSF: in process ?5/2 CSF: in process  ?5/2 HSV: pending  ?5/2 BCx: in process ? ?Thank you for allowing pharmacy to be a part of this patient?s care. ? ?Vance Peper, PharmD ?PGY1 Pharmacy Resident ?02/04/2022 9:45 PM  ? ?Please check AMION for all [place] phone numbers ?After 10:00 PM, call [facility] [phone] ? ? ?

## 2022-02-04 NOTE — ED Triage Notes (Signed)
Patient here POV from Home. ? ?Patient endorses waking up at 1400 today after a Work Meeting and having Severe Neck Stiffness associated with Chills, Body Aches and Rash.  ? ?Rash is mainly located on Right Shoulder, Neck, and Chest.  ? ?NAD Noted during Triage. A&Ox4. GCS 15. Ambulatory. ?

## 2022-02-04 NOTE — ED Notes (Signed)
Lumbar Tray left in the room with sterile gloves. ?

## 2022-02-04 NOTE — ED Notes (Addendum)
Date and time results received: 02/04/22 2257 ?(use smartphrase ".now" to insert current time) ? ?Test: CSF WBC ?Critical Value: tube1 114 / tube2 54 ? ?Name of Provider Notified: Randel Books MD / E. Imogene Burn MD ? ?Orders Received? Or Actions Taken?:  n/a ?

## 2022-02-05 DIAGNOSIS — E860 Dehydration: Secondary | ICD-10-CM | POA: Diagnosis present

## 2022-02-05 DIAGNOSIS — G40909 Epilepsy, unspecified, not intractable, without status epilepticus: Secondary | ICD-10-CM

## 2022-02-05 DIAGNOSIS — T39395A Adverse effect of other nonsteroidal anti-inflammatory drugs [NSAID], initial encounter: Secondary | ICD-10-CM | POA: Diagnosis present

## 2022-02-05 DIAGNOSIS — F419 Anxiety disorder, unspecified: Secondary | ICD-10-CM

## 2022-02-05 DIAGNOSIS — G971 Other reaction to spinal and lumbar puncture: Secondary | ICD-10-CM | POA: Diagnosis present

## 2022-02-05 DIAGNOSIS — N170 Acute kidney failure with tubular necrosis: Secondary | ICD-10-CM | POA: Diagnosis present

## 2022-02-05 DIAGNOSIS — Z888 Allergy status to other drugs, medicaments and biological substances status: Secondary | ICD-10-CM | POA: Diagnosis not present

## 2022-02-05 DIAGNOSIS — E876 Hypokalemia: Secondary | ICD-10-CM | POA: Diagnosis not present

## 2022-02-05 DIAGNOSIS — Y844 Aspiration of fluid as the cause of abnormal reaction of the patient, or of later complication, without mention of misadventure at the time of the procedure: Secondary | ICD-10-CM | POA: Diagnosis present

## 2022-02-05 DIAGNOSIS — E872 Acidosis, unspecified: Secondary | ICD-10-CM | POA: Diagnosis not present

## 2022-02-05 DIAGNOSIS — R748 Abnormal levels of other serum enzymes: Secondary | ICD-10-CM

## 2022-02-05 DIAGNOSIS — N179 Acute kidney failure, unspecified: Secondary | ICD-10-CM | POA: Diagnosis not present

## 2022-02-05 DIAGNOSIS — R21 Rash and other nonspecific skin eruption: Secondary | ICD-10-CM | POA: Diagnosis present

## 2022-02-05 DIAGNOSIS — T508X5A Adverse effect of diagnostic agents, initial encounter: Secondary | ICD-10-CM | POA: Diagnosis present

## 2022-02-05 DIAGNOSIS — N1411 Contrast-induced nephropathy: Secondary | ICD-10-CM | POA: Diagnosis not present

## 2022-02-05 DIAGNOSIS — F909 Attention-deficit hyperactivity disorder, unspecified type: Secondary | ICD-10-CM | POA: Diagnosis present

## 2022-02-05 DIAGNOSIS — G039 Meningitis, unspecified: Secondary | ICD-10-CM | POA: Diagnosis not present

## 2022-02-05 DIAGNOSIS — M542 Cervicalgia: Secondary | ICD-10-CM | POA: Diagnosis present

## 2022-02-05 DIAGNOSIS — R630 Anorexia: Secondary | ICD-10-CM | POA: Diagnosis present

## 2022-02-05 DIAGNOSIS — R739 Hyperglycemia, unspecified: Secondary | ICD-10-CM | POA: Diagnosis present

## 2022-02-05 DIAGNOSIS — Z882 Allergy status to sulfonamides status: Secondary | ICD-10-CM | POA: Diagnosis not present

## 2022-02-05 DIAGNOSIS — G030 Nonpyogenic meningitis: Secondary | ICD-10-CM | POA: Diagnosis present

## 2022-02-05 DIAGNOSIS — F32A Depression, unspecified: Secondary | ICD-10-CM | POA: Diagnosis present

## 2022-02-05 DIAGNOSIS — Z20822 Contact with and (suspected) exposure to covid-19: Secondary | ICD-10-CM | POA: Diagnosis present

## 2022-02-05 DIAGNOSIS — E875 Hyperkalemia: Secondary | ICD-10-CM | POA: Diagnosis not present

## 2022-02-05 DIAGNOSIS — R030 Elevated blood-pressure reading, without diagnosis of hypertension: Secondary | ICD-10-CM | POA: Diagnosis present

## 2022-02-05 DIAGNOSIS — Z79899 Other long term (current) drug therapy: Secondary | ICD-10-CM | POA: Diagnosis not present

## 2022-02-05 DIAGNOSIS — Z87891 Personal history of nicotine dependence: Secondary | ICD-10-CM | POA: Diagnosis not present

## 2022-02-05 HISTORY — DX: Abnormal levels of other serum enzymes: R74.8

## 2022-02-05 HISTORY — DX: Rash and other nonspecific skin eruption: R21

## 2022-02-05 LAB — HEPATITIS PANEL, ACUTE
HCV Ab: NONREACTIVE
Hep A IgM: NONREACTIVE
Hep B C IgM: NONREACTIVE
Hepatitis B Surface Ag: NONREACTIVE

## 2022-02-05 LAB — BASIC METABOLIC PANEL
Anion gap: 10 (ref 5–15)
BUN: 7 mg/dL (ref 6–20)
CO2: 19 mmol/L — ABNORMAL LOW (ref 22–32)
Calcium: 8.5 mg/dL — ABNORMAL LOW (ref 8.9–10.3)
Chloride: 107 mmol/L (ref 98–111)
Creatinine, Ser: 0.56 mg/dL (ref 0.44–1.00)
GFR, Estimated: >60 mL/min (ref >60)
Glucose, Bld: 231 mg/dL — ABNORMAL HIGH (ref 70–99)
Potassium: 4.1 mmol/L (ref 3.5–5.1)
Sodium: 136 mmol/L (ref 135–145)

## 2022-02-05 LAB — RAPID URINE DRUG SCREEN, HOSP PERFORMED
Amphetamines: NOT DETECTED
Barbiturates: NOT DETECTED
Benzodiazepines: POSITIVE — AB
Cocaine: NOT DETECTED
Opiates: POSITIVE — AB
Tetrahydrocannabinol: POSITIVE — AB

## 2022-02-05 LAB — HIV ANTIBODY (ROUTINE TESTING W REFLEX): HIV Screen 4th Generation wRfx: NONREACTIVE

## 2022-02-05 LAB — PATHOLOGIST SMEAR REVIEW
Path Review: INCREASED
Path Review: INCREASED

## 2022-02-05 LAB — LACTIC ACID, PLASMA: Lactic Acid, Venous: 1.2 mmol/L (ref 0.5–1.9)

## 2022-02-05 MED ORDER — ALBUTEROL SULFATE (2.5 MG/3ML) 0.083% IN NEBU
2.5000 mg | INHALATION_SOLUTION | Freq: Four times a day (QID) | RESPIRATORY_TRACT | Status: DC
  Filled 2022-02-05: qty 3

## 2022-02-05 MED ORDER — LISDEXAMFETAMINE DIMESYLATE 10 MG PO CAPS
50.0000 mg | ORAL_CAPSULE | Freq: Every day | ORAL | Status: DC
  Filled 2022-02-05: qty 1

## 2022-02-05 MED ORDER — LISDEXAMFETAMINE DIMESYLATE 30 MG PO CAPS
50.0000 mg | ORAL_CAPSULE | Freq: Every day | ORAL | Status: DC
  Filled 2022-02-05 (×2): qty 1

## 2022-02-05 MED ORDER — SODIUM CHLORIDE 0.9% FLUSH
3.0000 mL | Freq: Two times a day (BID) | INTRAVENOUS | Status: DC
  Administered 2022-02-05 – 2022-02-12 (×11): 3 mL via INTRAVENOUS

## 2022-02-05 MED ORDER — KETOROLAC TROMETHAMINE 15 MG/ML IJ SOLN
15.0000 mg | Freq: Once | INTRAMUSCULAR | Status: AC
  Administered 2022-02-05: 15 mg via INTRAVENOUS
  Filled 2022-02-05: qty 1

## 2022-02-05 MED ORDER — PANTOPRAZOLE SODIUM 40 MG IV SOLR
40.0000 mg | INTRAVENOUS | Status: DC
  Administered 2022-02-05 – 2022-02-06 (×2): 40 mg via INTRAVENOUS
  Filled 2022-02-05 (×2): qty 10

## 2022-02-05 MED ORDER — KETOROLAC TROMETHAMINE 15 MG/ML IJ SOLN
15.0000 mg | Freq: Four times a day (QID) | INTRAMUSCULAR | Status: DC
  Administered 2022-02-05 – 2022-02-06 (×3): 15 mg via INTRAVENOUS
  Filled 2022-02-05 (×3): qty 1

## 2022-02-05 MED ORDER — ESCITALOPRAM OXALATE 10 MG PO TABS
20.0000 mg | ORAL_TABLET | Freq: Every day | ORAL | Status: DC
  Administered 2022-02-05 – 2022-02-12 (×8): 20 mg via ORAL
  Filled 2022-02-05 (×8): qty 2

## 2022-02-05 MED ORDER — ACETAMINOPHEN 650 MG RE SUPP
650.0000 mg | Freq: Four times a day (QID) | RECTAL | Status: DC | PRN
Start: 2022-02-05 — End: 2022-02-12

## 2022-02-05 MED ORDER — ENOXAPARIN SODIUM 40 MG/0.4ML IJ SOSY
40.0000 mg | PREFILLED_SYRINGE | INTRAMUSCULAR | Status: DC
  Administered 2022-02-05 – 2022-02-12 (×8): 40 mg via SUBCUTANEOUS
  Filled 2022-02-05 (×8): qty 0.4

## 2022-02-05 MED ORDER — ALBUTEROL SULFATE (2.5 MG/3ML) 0.083% IN NEBU: 2.5000 mg | INHALATION_SOLUTION | Freq: Four times a day (QID) | RESPIRATORY_TRACT | Status: DC | PRN

## 2022-02-05 MED ORDER — MORPHINE SULFATE (PF) 2 MG/ML IV SOLN
1.0000 mg | INTRAVENOUS | Status: DC | PRN
  Administered 2022-02-05 (×4): 2 mg via INTRAVENOUS
  Administered 2022-02-06 (×3): 4 mg via INTRAVENOUS
  Administered 2022-02-06 (×2): 2 mg via INTRAVENOUS
  Administered 2022-02-06: 4 mg via INTRAVENOUS
  Administered 2022-02-06 (×2): 2 mg via INTRAVENOUS
  Administered 2022-02-06 – 2022-02-07 (×4): 4 mg via INTRAVENOUS
  Administered 2022-02-07 (×3): 2 mg via INTRAVENOUS
  Administered 2022-02-07: 4 mg via INTRAVENOUS
  Administered 2022-02-08: 2 mg via INTRAVENOUS
  Administered 2022-02-08: 4 mg via INTRAVENOUS
  Administered 2022-02-08: 2 mg via INTRAVENOUS
  Filled 2022-02-05 (×3): qty 2
  Filled 2022-02-05 (×3): qty 1
  Filled 2022-02-05: qty 2
  Filled 2022-02-05: qty 1
  Filled 2022-02-05: qty 2
  Filled 2022-02-05: qty 1
  Filled 2022-02-05 (×2): qty 2
  Filled 2022-02-05: qty 1
  Filled 2022-02-05: qty 2
  Filled 2022-02-05: qty 1
  Filled 2022-02-05: qty 2
  Filled 2022-02-05 (×2): qty 1
  Filled 2022-02-05: qty 2
  Filled 2022-02-05: qty 1
  Filled 2022-02-05: qty 2
  Filled 2022-02-05: qty 1

## 2022-02-05 MED ORDER — LEVETIRACETAM 500 MG PO TABS
500.0000 mg | ORAL_TABLET | Freq: Two times a day (BID) | ORAL | Status: DC
  Administered 2022-02-05 – 2022-02-12 (×14): 500 mg via ORAL
  Filled 2022-02-05 (×16): qty 1

## 2022-02-05 MED ORDER — ACETAMINOPHEN 325 MG PO TABS
650.0000 mg | ORAL_TABLET | Freq: Four times a day (QID) | ORAL | Status: DC | PRN
  Administered 2022-02-07 – 2022-02-12 (×14): 650 mg via ORAL
  Filled 2022-02-05 (×15): qty 2

## 2022-02-05 MED ORDER — ONDANSETRON HCL 4 MG PO TABS
4.0000 mg | ORAL_TABLET | Freq: Four times a day (QID) | ORAL | Status: DC | PRN
  Administered 2022-02-06 – 2022-02-10 (×2): 4 mg via ORAL
  Filled 2022-02-05 (×3): qty 1

## 2022-02-05 MED ORDER — HYDROMORPHONE HCL 1 MG/ML IJ SOLN
1.0000 mg | Freq: Once | INTRAMUSCULAR | Status: AC
  Administered 2022-02-05: 1 mg via INTRAVENOUS
  Filled 2022-02-05: qty 1

## 2022-02-05 MED ORDER — SODIUM CHLORIDE 0.9 % IV SOLN
2.0000 g | Freq: Two times a day (BID) | INTRAVENOUS | Status: DC
  Administered 2022-02-05: 2 g via INTRAVENOUS
  Filled 2022-02-05: qty 20

## 2022-02-05 MED ORDER — ONDANSETRON HCL 4 MG/2ML IJ SOLN
4.0000 mg | Freq: Four times a day (QID) | INTRAMUSCULAR | Status: DC | PRN
  Administered 2022-02-06 – 2022-02-09 (×9): 4 mg via INTRAVENOUS
  Filled 2022-02-05 (×8): qty 2

## 2022-02-05 MED ORDER — LISDEXAMFETAMINE DIMESYLATE 20 MG PO CAPS
20.0000 mg | ORAL_CAPSULE | Freq: Every day | ORAL | Status: DC
  Administered 2022-02-06 – 2022-02-12 (×7): 20 mg via ORAL
  Filled 2022-02-05 (×7): qty 1

## 2022-02-05 NOTE — TOC Progression Note (Signed)
Transition of Care (TOC) - Progression Note  ? ? ?Patient Details  ?Name: Wendy Vega ?MRN: [id number] ?Date of Birth: 07-02-1996 ? ?Transition of Care (TOC) CM/SW Contact  ?Leone Haven, RN ?Phone Number: ?02/05/2022, 4:19 PM ? ?Clinical Narrative:    ?From home alone,  minigitis work up today, iv abx.  Cx's pending.  TOC will continue to follow for dc needs.  ? ? ?  ?  ? ?Expected Discharge Plan and Services ?  ?  ?  ?  ?  ?                ?  ?  ?  ?  ?  ?  ?  ?  ?  ?  ? ? ?Social Determinants of Health (SDOH) Interventions ?  ? ?Readmission Risk Interventions ?   ? View : No data to display.  ?  ?  ?  ? ? ?

## 2022-02-05 NOTE — H&P (Addendum)
? ?[facility] ?History and Physical ?                                                                                  ? ?Wendy Vega, is a 26 y.o. female  MRN: [id number]   DOB - Nov 23, 1995 ? ?Admit Date - 02/04/2022    ? ?Expected date of discharge: 02/09/2022 ? ?Outpatient Primary MD for the patient is Lynden AngLane, Elizabeth, NP ? ?Referring MD: Rancour (EDP) ? ?PMH: ?Past Medical History:  ?Diagnosis Date  ? Anxiety   ? Depression   ?   ? ?PSH: ?Past Surgical History:  ?Procedure Laterality Date  ? left arm surgery     ? ? ? ?CC:  ?Chief Complaint  ?Patient presents with  ? Torticollis  ?  ? ?HPI: ?26 year old female with past medical history of depression and anxiety.  She awakened from a nap around 11 AM on 5/2 and found herself to be experiencing a rash that involves parts of the upper chest, the entire face, areas radiating from the right neck to the posterior neck and head.  This was nonpruritic but was an appearance.  This was associated with chills, severe neck pain, headache, overall stiffness and myalgias.  No recent injuries or foreign travel.  No other symptoms such as abdominal pain, rhinorrhea, cough.  No fevers.  No visual disturbances or extremity weakness or numbness.  Patient does have nausea and has been having some difficulty eating.  No prior symptoms.  She denies tick bites. ? ?ER Evaluation and treatment: ?In the ER she underwent a CTA of the head which was unremarkable. ?Labs were significant for leukocytosis with elevation in neutrophils; respiratory panel was negative for influenza and COVID; UA unremarkable except for elevated specific gravity of 1.038 which was concerning for dehydration.  CK was also elevated at 551, lactic acid x2 was normal.  CO2 slightly low at 21 with repeat 19 with associated hyperglycemia after Decadron ?Patient underwent a lumbar puncture in the ER that revealed bloody fluid but otherwise was not consistent with bacterial meningitis.  Total protein was 62 in  context of blood, glucose was 70 and tube 1 of WBCs 5114 and tube 4 was 54. ?She had been given doses of empiric vancomycin, acyclovir and Rocephin.  [facility]ist continued vancomycin and acyclovir.  She was placed on airborne and contact precautions. ? ?Review of Systems   ?In addition to the HPI above,  ?No Fever-++chills, myalgias  ?++ Headache, no changes with Vision or hearing, no photophobia, new weakness, tingling, numbness in any extremity,++ neck pain and stiffness ?No problems swallowing food or Liquids, indigestion/reflux ?No Chest pain, Cough or Shortness of Breath, palpitations, orthopnea or DOE ?No Abdominal pain, emesis; ++ nausea, no melena or hematochezia, no dark tarry stools, Bowel movements are regular, ?No dysuria, hematuria or flank pain ?++ new petechial skin rash involving the upper chest, neck and facial area, lesions, masses or bruises, ?No effusion or redness ?No recent weight gain or loss ?No polyuria, polydypsia or polyphagia, ? ?*A full 10 point Review of Systems was done, except as stated above, all other Review of Systems were negative. ? ?Social History ?Social History  ? ?  Tobacco Use  ? Smoking status: Never  ? Smokeless tobacco: Never  ?Substance Use Topics  ? Alcohol use: Yes  ?  Comment: occ  ? ? ?Resides at: ?Home ? ?Lives with: ?Family ? ?Ambulatory status: ?Independent ? ?Family History ?Family History  ?Problem Relation Age of Onset  ? Cancer Other   ? Hyperlipidemia Other   ? ? ?Prior to Admission medications   ?Medication Sig Start Date End Date Taking? Authorizing Provider  ?escitalopram (LEXAPRO) 20 MG tablet TAKE 1 TABLET BY MOUTH DAILY. ?Patient taking differently: Take 20 mg by mouth daily. 12/23/21   Mozingo, Thereasa Solo, NP  ?levETIRAcetam (KEPPRA) 500 MG tablet Take 500 mg by mouth 2 (two) times daily. 12/31/21   [provider]  ?lisdexamfetamine (VYVANSE) 50 MG capsule Take 1 capsule (50 mg total) by mouth daily. 12/23/21   Mozingo,  Thereasa Solo, NP  ? ? ?Allergies  ?Allergen Reactions  ? Sulfa Antibiotics Other (See Comments)  ?  Lips turn blue and swelling   ? Sulfacetamide Sodium Other (See Comments)  ?  Lips turn blue and swelling   ? ? ?Physical Exam ? ?Vitals ? ?Blood pressure 118/65, pulse 64, temperature 98.4 ?F (36.9 ?C), temperature source Oral, resp. rate 18, height 5\' 9"  (1.753 m), weight 89.6 kg, last menstrual period 01/23/2022, SpO2 100 %. ? ? ?General:  In no acute distress, mildly toxic in appearance ? ?Psych:  Normal affect, Denies Suicidal or Homicidal ideations, Awake Alert, Oriented X 3. Speech and thought patterns are clear and appropriate, no apparent short term memory deficits ? ?Neuro:   No focal neurological deficits, CN II through XII intact, Strength 5/5 all 4 extremities, Sensation intact all 4 extremities.  Positive nuchal rigidity on exam has tested with chin to chest maneuver ? ?ENT:  Ears and Eyes appear Normal, Conjunctivae clear, PER. Moist oral mucosa without erythema or exudates. ? ?Neck:  Supple, No lymphadenopathy appreciated ? ?Respiratory:  Symmetrical chest wall movement, Good air movement bilaterally, CTAB. Room Air ? ?Cardiac:  RRR, No Murmurs, no LE edema noted, no JVD, No carotid bruits, peripheral pulses palpable at 2+ ? ?Abdomen:  Positive bowel sounds, Soft, Non tender, Non distended,  No masses appreciated, no obvious hepatosplenomegaly ? ?Skin:  No Cyanosis, Normal Skin Turgor,  ? ?Extremities: Symmetrical without obvious trauma or injury,  no effusions. ? ?Data Review ? ?CBC ?Recent Labs  ?Lab 02/04/22 ?1811  ?WBC 17.1*  ?HGB 14.4  ?HCT 43.7  ?PLT 247  ?MCV 89.0  ?MCH 29.3  ?MCHC 33.0  ?RDW 12.7  ?LYMPHSABS 1.2  ?MONOABS 0.8  ?EOSABS 0.0  ?BASOSABS 0.1  ? ? ?Chemistries  ?Recent Labs  ?Lab 02/04/22 ?1811 02/05/22 ?04/07/22  ?NA 138 136  ?K 3.8 4.1  ?CL 106 107  ?CO2 21* 19*  ?GLUCOSE 112* 231*  ?BUN 12 7  ?CREATININE 0.66 0.56  ?CALCIUM 9.2 8.5*  ?AST 20  --   ?ALT 15  --   ?ALKPHOS 62  --    ?BILITOT 0.3  --   ? ? ?estimated creatinine clearance is 128.3 mL/min (by C-G formula based on SCr of 0.56 mg/dL). ? ?No results for input(s): TSH, T4TOTAL, T3FREE, THYROIDAB in the last 72 hours. ? ?Invalid input(s): FREET3 ? ?Coagulation profile ?Recent Labs  ?Lab 02/04/22 ?2024  ?INR 1.1  ? ? ?No results for input(s): DDIMER in the last 72 hours. ? ?Cardiac Enzymes ?No results for input(s): CKMB, TROPONINI, MYOGLOBIN in the last 168 hours. ? ?Invalid input(s): CK ? ?  Invalid input(s): POCBNP ? ?Urinalysis ?   ?Component Value Date/Time  ? COLORURINE YELLOW 02/04/2022 1811  ? APPEARANCEUR CLEAR 02/04/2022 1811  ? LABSPEC 1.038 (H) 02/04/2022 1811  ? PHURINE 5.5 02/04/2022 1811  ? GLUCOSEU NEGATIVE 02/04/2022 1811  ? HGBUR NEGATIVE 02/04/2022 1811  ? BILIRUBINUR NEGATIVE 02/04/2022 1811  ? KETONESUR NEGATIVE 02/04/2022 1811  ? PROTEINUR TRACE (A) 02/04/2022 1811  ? UROBILINOGEN 0.2 09/14/2014 2026  ? NITRITE NEGATIVE 02/04/2022 1811  ? LEUKOCYTESUR NEGATIVE 02/04/2022 1811  ? ? ?Imaging results:  ? ?CT ANGIO HEAD NECK W WO CM ? ?Result Date: 02/04/2022 ?CLINICAL DATA:  Neck pain and thrombocytopenia EXAM: CT ANGIOGRAPHY HEAD AND NECK TECHNIQUE: Multidetector CT imaging of the head and neck was performed using the standard protocol during bolus administration of intravenous contrast. Multiplanar CT image reconstructions and MIPs were obtained to evaluate the vascular anatomy. Carotid stenosis measurements (when applicable) are obtained utilizing NASCET criteria, using the distal internal carotid diameter as the denominator. RADIATION DOSE REDUCTION: This exam was performed according to the departmental dose-optimization program which includes automated exposure control, adjustment of the mA and/or kV according to patient size and/or use of iterative reconstruction technique. CONTRAST:  75mL OMNIPAQUE IOHEXOL 350 MG/ML SOLN COMPARISON:  05/11/2020 FINDINGS: CT HEAD FINDINGS Brain: There is no mass, hemorrhage or  extra-axial collection. The size and configuration of the ventricles and extra-axial CSF spaces are normal. There is no acute or chronic infarction. The brain parenchyma is normal. Skull: The visualized skull base, cal

## 2022-02-06 DIAGNOSIS — N179 Acute kidney failure, unspecified: Secondary | ICD-10-CM

## 2022-02-06 DIAGNOSIS — G039 Meningitis, unspecified: Secondary | ICD-10-CM | POA: Diagnosis not present

## 2022-02-06 DIAGNOSIS — E876 Hypokalemia: Secondary | ICD-10-CM

## 2022-02-06 LAB — BASIC METABOLIC PANEL
Anion gap: 10 (ref 5–15)
BUN: 9 mg/dL (ref 6–20)
CO2: 20 mmol/L — ABNORMAL LOW (ref 22–32)
Calcium: 8.1 mg/dL — ABNORMAL LOW (ref 8.9–10.3)
Chloride: 109 mmol/L (ref 98–111)
Creatinine, Ser: 1.2 mg/dL — ABNORMAL HIGH (ref 0.44–1.00)
GFR, Estimated: >60 mL/min (ref >60)
Glucose, Bld: 99 mg/dL (ref 70–99)
Potassium: 3.4 mmol/L — ABNORMAL LOW (ref 3.5–5.1)
Sodium: 139 mmol/L (ref 135–145)

## 2022-02-06 LAB — CBC
HCT: 36.3 % (ref 36.0–46.0)
Hemoglobin: 12.1 g/dL (ref 12.0–15.0)
MCH: 30.2 pg (ref 26.0–34.0)
MCHC: 33.3 g/dL (ref 30.0–36.0)
MCV: 90.5 fL (ref 80.0–100.0)
Platelets: 170 10*3/uL (ref 150–400)
RBC: 4.01 MIL/uL (ref 3.87–5.11)
RDW: 12.5 % (ref 11.5–15.5)
WBC: 12.4 10*3/uL — ABNORMAL HIGH (ref 4.0–10.5)
nRBC: 0 % (ref 0.0–0.2)

## 2022-02-06 LAB — ROCKY MTN SPOTTED FVR ABS PNL(IGG+IGM)
RMSF IgG: NEGATIVE
RMSF IgM: 0.56 index (ref 0.00–0.89)

## 2022-02-06 LAB — LYME DISEASE SEROLOGY W/REFLEX: Lyme Total Antibody EIA: NEGATIVE

## 2022-02-06 LAB — HSV 1/2 PCR, CSF
HSV-1 DNA: NEGATIVE
HSV-2 DNA: NEGATIVE

## 2022-02-06 LAB — LEVETIRACETAM LEVEL: Levetiracetam Lvl: 2.5 ug/mL — ABNORMAL LOW (ref 10.0–40.0)

## 2022-02-06 LAB — CK: Total CK: 216 U/L (ref 38–234)

## 2022-02-06 MED ORDER — DEXTROSE 5 % IV SOLN
10.0000 mg/kg | Freq: Three times a day (TID) | INTRAVENOUS | Status: DC
  Administered 2022-02-06 – 2022-02-07 (×4): 905 mg via INTRAVENOUS
  Filled 2022-02-06 (×6): qty 18.1

## 2022-02-06 MED ORDER — POTASSIUM CHLORIDE CRYS ER 20 MEQ PO TBCR
40.0000 meq | EXTENDED_RELEASE_TABLET | Freq: Once | ORAL | Status: AC
  Administered 2022-02-06: 40 meq via ORAL
  Filled 2022-02-06: qty 2

## 2022-02-06 MED ORDER — VANCOMYCIN HCL IN DEXTROSE 1-5 GM/200ML-% IV SOLN
1000.0000 mg | Freq: Three times a day (TID) | INTRAVENOUS | Status: DC
  Administered 2022-02-06: 1000 mg via INTRAVENOUS

## 2022-02-06 MED ORDER — SODIUM CHLORIDE 0.9 % IV SOLN: INTRAVENOUS | Status: DC

## 2022-02-06 MED ORDER — DOXYCYCLINE HYCLATE 100 MG PO TABS
100.0000 mg | ORAL_TABLET | Freq: Two times a day (BID) | ORAL | Status: DC
  Administered 2022-02-06 – 2022-02-12 (×12): 100 mg via ORAL
  Filled 2022-02-06 (×14): qty 1

## 2022-02-06 NOTE — Assessment & Plan Note (Signed)
Suspected due to a dose of Decadron in ED. ?FBS on a.m. labs 99. ?

## 2022-02-06 NOTE — Assessment & Plan Note (Signed)
Continue home Lexapro and Vyvanse. ?

## 2022-02-06 NOTE — Assessment & Plan Note (Addendum)
Resolved

## 2022-02-06 NOTE — Progress Notes (Signed)
?PROGRESS NOTE ?  ?Wendy Vega  HMC:[id number]    DOB: [birth year]    DOA: 02/04/2022 ? ?PCP: Lynden Ang, NP  ? ?I have briefly reviewed patients previous medical records in [facility]. ? ?Chief Complaint  ?Patient presents with  ? Torticollis  ? ? ?Hospital Course:  ?26 year old female with medical history significant for anxiety, depression, seizure disorder on Keppra and ADHD, presented with complaints of generalized pains, acute onset of petechial rash of face, upper chest and neck pain and stiffness.  S/p lumbar puncture.  Admitted for suspected meningitis.  ID consulted. ? ?Assessment and Plan: ?* Meningitis ?Presentation concern for meningitis. ?Normal CTA of head and neck. ?CSF 42 K RBCs, 114 WBCs, predominantly neutrophilic, total protein 62. ?CSF culture and blood cultures x2: Negative to date. ?Started empirically on IV acyclovir, ceftriaxone and vancomycin. ?Follow serology for Ehrlichia, HSV PCR, RMSF.  Lyme's antibody negative.  Serum pregnancy test negative.  HIV and hepatitis panel negative. ?ID consulted and suspecting possible aseptic meningitis with concerns of RMSF given petechial rash.  Discontinued vancomycin and ceftriaxone as CSF studies probably suggest traumatic tap.  Continuing IV acyclovir pending HSV 1/2.  Doxycycline added to cover for RMSF. ?Supportive treatment for headache and nausea. ?Skin rash is improved. ? ?AKI (acute kidney injury) (HCC) ?May be related to dehydration from nausea and vomiting and Toradol use. ?Discontinued IV Toradol.  Avoid nephrotoxic's. ?Continue gentle IV fluids and trend BMP daily. ? ?Rash ?Improved. ? ?Elevated CK ?CK 551 on admission.  Unclear etiology. ?We will add CK to this morning's labs. ? ?Dehydration, mild ?Improved.  Given nausea and vomiting this morning, continue gentle IV fluids for additional 24 hours. ? ?Seizure disorder (HCC) ?Continue home dose of Keppra.  Seizure precautions. ? ?Anxiety and depression ?Continue home Lexapro and  Vyvanse. ? ?Acute hyperglycemia ?Suspected due to a dose of Decadron in ED. ?FBS on a.m. labs 99. ? ?Hypokalemia ?Replace and follow. ? ? ?Body mass index is 29.17 kg/m?. ? ? ?DVT prophylaxis: enoxaparin (LOVENOX) injection 40 mg Start: 02/05/22 0815   ?  Code Status: Full Code:  ?Family Communication: None at bedside ?Disposition:  ?Status is: Inpatient ?Remains inpatient appropriate because: IV antimicrobials ?  ? ?Consultants:   ?Infectious disease ? ?Procedures:   ?LP ? ?Antimicrobials:   ?As above ? ? ?Subjective:  ?Patient reports that she had bad headache this morning with nausea and vomiting.  No photophobia.  Neck pain and stiffness are better.  Cannot see her rash and unable to comment.  Back pain at site of LP.  Denies sick contacts or insect bites.  Has not traveled recently. ? ?Objective:  ? ?Vitals:  ? 02/05/22 0810 02/05/22 1219 02/05/22 2030 02/06/22 0025  ?BP: 118/65 (!) 114/56 134/82 125/61  ?Pulse: 64 63 69 83  ?Resp: 18 16    ?Temp:  98.5 ?F (36.9 ?C) (!) 97.1 ?F (36.2 ?C) 98.1 ?F (36.7 ?C)  ?TempSrc:  Oral Oral Oral  ?SpO2: 100% 100% 100% 98%  ?Weight:      ?Height:      ?Patient examined along with her female nurse chaperone at bedside. ? ?General exam: Young female, moderately built and overweight lying comfortably propped up in bed without distress.  Does not look septic or toxic.  Oral mucosa with borderline hydration. ?Respiratory system: Clear to auscultation. Respiratory effort normal. ?Cardiovascular system: S1 & S2 heard, RRR. No JVD, murmurs, rubs, gallops or clicks. No pedal edema. ?Gastrointestinal system: Abdomen is nondistended, soft and nontender.  No organomegaly or masses felt. Normal bowel sounds heard. ?Central nervous system: Alert and oriented. No focal neurological deficits. ?Extremities: Symmetric 5 x 5 power. ?Skin: Facial rash seems to have resolved.  Minimal rash right base of the neck and left upper anterior chest, has not crossed the pen mark drawn around it on  admission and appears to be fading ?Psychiatry: Judgement and insight appear normal. Mood & affect appropriate.  ? ?Data Reviewed:   ?I have personally reviewed following labs and imaging studies ? ?CBC: ?Recent Labs  ?Lab 02/04/22 ?1811 02/06/22 ?0243  ?WBC 17.1* 12.4*  ?NEUTROABS 14.9*  --   ?HGB 14.4 12.1  ?HCT 43.7 36.3  ?MCV 89.0 90.5  ?PLT 247 170  ? ?Basic Metabolic Panel: ?Recent Labs  ?Lab 02/04/22 ?1811 02/05/22 ?4403 02/06/22 ?0243  ?NA 138 136 139  ?K 3.8 4.1 3.4*  ?CL 106 107 109  ?CO2 21* 19* 20*  ?GLUCOSE 112* 231* 99  ?BUN 12 7 9   ?CREATININE 0.66 0.56 1.20*  ?CALCIUM 9.2 8.5* 8.1*  ? ?Liver Function Tests: ?Recent Labs  ?Lab 02/04/22 ?1811  ?AST 20  ?ALT 15  ?ALKPHOS 62  ?BILITOT 0.3  ?PROT 7.5  ?ALBUMIN 4.6  ? ?CBG: ?No results for input(s): GLUCAP in the last 168 hours. ?Microbiology Studies:  ? ?Recent Results (from the past 240 hour(s))  ?Resp Panel by RT-PCR (Flu A&B, Covid) Nasopharyngeal Swab     Status: None  ? Collection Time: 02/04/22  6:11 PM  ? Specimen: Nasopharyngeal Swab; Nasopharyngeal(NP) swabs in vial transport medium  ?Result Value Ref Range Status  ? SARS Coronavirus 2 by RT PCR NEGATIVE NEGATIVE Final  ?  Comment: (NOTE) ?SARS-CoV-2 target nucleic acids are NOT DETECTED. ? ?The SARS-CoV-2 RNA is generally detectable in upper respiratory ?specimens during the acute phase of infection. The lowest ?concentration of SARS-CoV-2 viral copies this assay can detect is ?138 copies/mL. A negative result does not preclude SARS-Cov-2 ?infection and should not be used as the sole basis for treatment or ?other patient management decisions. A negative result may occur with  ?improper specimen collection/handling, submission of specimen other ?than nasopharyngeal swab, presence of viral mutation(s) within the ?areas targeted by this assay, and inadequate number of viral ?copies(<138 copies/mL). A negative result must be combined with ?clinical observations, patient history, and  epidemiological ?information. The expected result is Negative. ? ?Fact Sheet for Patients:  ?04/06/22 ? ?Fact Sheet for Healthcare Providers:  ?BloggerCourse.com ? ?This test is no t yet approved or cleared by the [place] FDA and  ?has been authorized for detection and/or diagnosis of SARS-CoV-2 by ?FDA under an Emergency Use Authorization (EUA). This EUA will remain  ?in effect (meaning this test can be used) for the duration of the ?COVID-19 declaration under Section 564(b)(1) of the Act, 21 ?U.S.C.section 360bbb-3(b)(1), unless the authorization is terminated  ?or revoked sooner.  ? ? ?  ? Influenza A by PCR NEGATIVE NEGATIVE Final  ? Influenza B by PCR NEGATIVE NEGATIVE Final  ?  Comment: (NOTE) ?The Xpert Xpress SARS-CoV-2/FLU/RSV plus assay is intended as an aid ?in the diagnosis of influenza from Nasopharyngeal swab specimens and ?should not be used as a sole basis for treatment. Nasal washings and ?aspirates are unacceptable for Xpert Xpress SARS-CoV-2/FLU/RSV ?testing. ? ?Fact Sheet for Patients: ?[place] ? ?Fact Sheet for Healthcare Providers: ?BloggerCourse.com ? ?This test is not yet approved or cleared by the [place] FDA and ?has been authorized for detection and/or diagnosis of SARS-CoV-2 by ?FDA  under an Emergency Use Authorization (EUA). This EUA will remain ?in effect (meaning this test can be used) for the duration of the ?COVID-19 declaration under Section 564(b)(1) of the Act, 21 U.S.C. ?section 360bbb-3(b)(1), unless the authorization is terminated or ?revoked. ? ?Performed at [facility], [address], ?[place], [place] [address] ?  ?Blood culture (routine x 2)     Status: None (Preliminary result)  ? Collection Time: 02/04/22  6:23 PM  ? Specimen: BLOOD  ?Result Value Ref Range Status  ? Specimen Description   Final  ?  BLOOD RIGHT  ANTECUBITAL ?Performed at [facility], [address], [place], [place] [address] ?  ? Special Requests   Final  ?  Blood Culture adequate volume BOTTLES DRAWN AEROBIC AND ANAEROBIC ?Performed at [facility]

## 2022-02-06 NOTE — Assessment & Plan Note (Addendum)
Replaced. °

## 2022-02-06 NOTE — Assessment & Plan Note (Addendum)
Continue home dose of Keppra.   ?

## 2022-02-06 NOTE — Assessment & Plan Note (Addendum)
Likely multifactorial: Dehydration from nausea and vomiting, ATN from contrast for CTA chest on admission and IV Toradol.   ?Renal function has improved.  NSAIDs were discontinued.  Patient was seen by nephrology.  Given IV sodium bicarbonate infusion.  Will be discharged on oral sodium bicarbonate.  Nephrology will arrange outpatient follow-up. ?

## 2022-02-06 NOTE — Assessment & Plan Note (Addendum)
Presentation concerning for meningitis. ?Normal CTA of head and neck. ?CSF 42 K RBCs, 114 WBCs, predominantly neutrophilic, total protein 62. ?CSF culture and blood cultures x2: Negative to date. ?Started empirically on IV acyclovir, ceftriaxone and vancomycin. ?Follow serology for Ehrlichia (negative), HSV 1/2 PCR (negative), RMSF (negative).  Lyme's antibody negative.  Serum pregnancy test negative.  HIV and hepatitis panel negative. ?ID consulted and suspecting possible aseptic meningitis with concerns of RMSF given petechial rash.   ?Discontinued vancomycin and ceftriaxone as CSF studies probably suggest traumatic tap.   ?Skin rash resolved. ?HSV 1 and 2 PCR and RMSF serology are negative ?As per ID signoff 5/5: Acyclovir discontinued, recommend completing 7 days of doxycycline (completes course after 5/10 doses).  All isolation discontinued. ?Course complicated by post LP headache, failed conservative measures, finally underwent blood patch by IR on 5/8.  Headache is now resolved. ?Discussed with Dr. West Bali 5/9 and given that she could have other tickborne illnesses, recommended completing total 7 days of doxycycline. ?Clinically improved.  ?

## 2022-02-06 NOTE — Consult Note (Signed)
? ? ?[facility]  ?                                                                                     ? ?Patient Identification: ?Patient Name: Wendy Vega MRN: [id number] [place] Date: 02/04/2022  5:15 PM ?Today's Date: 02/06/2022 ?Reason for consult: concern for meningitis ?Requesting provider: Vernell Leep ? ?Principal Problem: ?  Meningitis ?Active Problems: ?  Acute hyperglycemia ?  Elevated CK ?  Dehydration, mild ?  Anxiety ?  Depression ?  Rash ?  Seizure disorder ([place]) ?  ADHD ? ? ?Antibiotics:  ?Vancomycin 5/2- ?Ceftriaxone 5/2- ?Acyclovir 5/2- ? ?Lines/Hardware: ? ?Assessment ?# Possible Aseptic meningitis  ?  Petechial rashes in the face and upper chest ( fading) ?-  concerns for RMSF given petechial rashes although would have expected more peripheral distribution. CBC with no cytopenias, liver enzymes OK. HIV and acute Hepatitis panel negative  ? ?# AKI - in the setting of dehydration due to N/V, IVF ?# Leukocytosis - downtrending  ?# Back pain - started after she had LP done ? ? ?Recommendations  ?DC Vancomycin and ceftriaxone as CSF studies likely traumatic tap  ?Continue IV acyclovir pending HSV1/2 ?CSF ME panel to be sent ?Add Doxycycline for concerns for RMSF. Fu RMSF serology and Lyme disease serology  ?Ehrlichia ab panel added on ?Following  ? ?Rest of the management as per the primary team. Please call with questions or concerns.  ?Thank you for the consult ? ?Rosiland Oz, MD ?Infectious Disease Physician ?[facility] ?[address] ?[place], [place] [address] ?Phone: [phone]  Fax: [phone] ? ?__________________________________________________________________________________________________________ ?HPI and Hospital Course: ?26 YO Female with H/o ADHD, Seizure d/o on keppra, depression and anxiety who presented to the ED from home 5/2  with severe neck pain, chills, body ache and rash. She had these symptoms after she woke up from a nap 2 days ago in the afternoon and was feeling well prior to that. Denies fevers. Her mother showed me a picture of petechial rashes in her face. The rashes also involved her upper chest. She also has 1 episode of NBNB vomiting and photophobia. Denies any recent sick contacts, travel, tick bite and unusual food intake. She works as a Programme researcher, broadcasting/film/video in [employer]. Her mother tells she moved from her house a month and half ago and currently stays in her own apartment. She has a female sexual partner. She recently had an IUD removed. Mother tells she has received all her childhood immunizations as recommended. She still has headache, worse when she stands and neck pain ( seems to be improving with better mobility). She feels worse back pain at LP site ( did not have pain prior to LP).  Rashes seen in the face and upper chest seems to be fading. Denies blurry vision and hearing changes and any neurological symptoms  ? ?Used to smoke cigarettes and currently VAPES. Denies alcohol and IVDU.  ? ?ROS: all systems reviewed with pertinent positives and negatives as listed above  ? ?Past Medical History:  ?Diagnosis Date  ? Anxiety   ? Depression   ? ?  Past Surgical History:  ?Procedure Laterality Date  ? left arm surgery     ? ? ?Scheduled Meds: ? enoxaparin (LOVENOX) injection  40 mg Subcutaneous Q24H  ? escitalopram  20 mg Oral Daily  ? levETIRAcetam  500 mg Oral BID  ? lisdexamfetamine  20 mg Oral Daily  ? pantoprazole (PROTONIX) IV  40 mg Intravenous Q24H  ? sodium chloride flush  3 mL Intravenous Q12H  ? ?Continuous Infusions: ? sodium chloride 125 mL/hr at 02/06/22 0509  ? acyclovir    ? cefTRIAXone (ROCEPHIN)  IV 2 g (02/05/22 2223)  ? vancomycin    ? ?PRN Meds:.acetaminophen **OR** acetaminophen, albuterol, morphine injection, ondansetron **OR** ondansetron (ZOFRAN) IV ? ? ?Social History  ? ?Socioeconomic History  ? Marital  status: Single  ?  Spouse name: Not on file  ? Number of children: Not on file  ? Years of education: Not on file  ? Highest education level: Not on file  ?Occupational History  ? Not on file  ?Tobacco Use  ? Smoking status: Never  ? Smokeless tobacco: Never  ?Substance and Sexual Activity  ? Alcohol use: Yes  ?  Comment: occ  ? Drug use: Yes  ?  Frequency: 7.0 times per week  ?  Types: Marijuana  ? Sexual activity: Yes  ?  Birth control/protection: None  ?Other Topics Concern  ? Not on file  ?Social History Narrative  ? Not on file  ? ?Social Determinants of Health  ? ?Financial Resource Strain: Not on file  ?Food Insecurity: Not on file  ?Transportation Needs: Not on file  ?Physical Activity: Not on file  ?Stress: Not on file  ?Social Connections: Not on file  ?Intimate Partner Violence: Not on file  ? ?Breast Cancer-relatedfamily history is not on file. ? ?Vitals ?BP 125/61   Pulse 83   Temp 98.1 ?F (36.7 ?C) (Oral)   Resp 16   Ht 5\' 9"  (1.753 m)   Wt 89.6 kg   LMP 01/23/2022 (Approximate)   SpO2 98%   BMI 29.17 kg/m?  ? ? ?Physical Exam ?Constitutional:  lying in the bed.  ?   Comments: neck rigidity  ? ?Cardiovascular:  ?   Rate and Rhythm: Normal rate and regular rhythm.  ?   Heart sounds: ? ?Pulmonary:  ?   Effort: Pulmonary effort is normal on room air  ?   Comments:  ? ?Abdominal:  ?   Palpations: Abdomen is soft.  ?   Tenderness: non tender and non distended  ? ?Musculoskeletal:     ?   General: No swelling or tenderness.  ? ?Skin: ?   Comments: fading petechial rashes in the face and upper chest. No rashes in the lower extremities, upper extremities and back  ? ?Neurological:  ?   General: awake, alert and oriented, grossly non focal  ? ?Psychiatric:     ?   Mood and Affect: Mood normal.  ? ? ?Pertinent Microbiology ?Results for orders placed or performed during the hospital encounter of 02/04/22  ?Resp Panel by RT-PCR (Flu A&B, Covid) Nasopharyngeal Swab     Status: None  ? Collection Time:  02/04/22  6:11 PM  ? Specimen: Nasopharyngeal Swab; Nasopharyngeal(NP) swabs in vial transport medium  ?Result Value Ref Range Status  ? SARS Coronavirus 2 by RT PCR NEGATIVE NEGATIVE Final  ?  Comment: (NOTE) ?SARS-CoV-2 target nucleic acids are NOT DETECTED. ? ?The SARS-CoV-2 RNA is generally detectable in upper respiratory ?specimens during the acute phase  of infection. The lowest ?concentration of SARS-CoV-2 viral copies this assay can detect is ?138 copies/mL. A negative result does not preclude SARS-Cov-2 ?infection and should not be used as the sole basis for treatment or ?other patient management decisions. A negative result may occur with  ?improper specimen collection/handling, submission of specimen other ?than nasopharyngeal swab, presence of viral mutation(s) within the ?areas targeted by this assay, and inadequate number of viral ?copies(<138 copies/mL). A negative result must be combined with ?clinical observations, patient history, and epidemiological ?information. The expected result is Negative. ? ?Fact Sheet for Patients:  ?EntrepreneurPulse.com.au ? ?Fact Sheet for Healthcare Providers:  ?IncredibleEmployment.be ? ?This test is no t yet approved or cleared by the [place] FDA and  ?has been authorized for detection and/or diagnosis of SARS-CoV-2 by ?FDA under an Emergency Use Authorization (EUA). This EUA will remain  ?in effect (meaning this test can be used) for the duration of the ?COVID-19 declaration under Section 564(b)(1) of the Act, 21 ?U.S.C.section 360bbb-3(b)(1), unless the authorization is terminated  ?or revoked sooner.  ? ? ?  ? Influenza A by PCR NEGATIVE NEGATIVE Final  ? Influenza B by PCR NEGATIVE NEGATIVE Final  ?  Comment: (NOTE) ?The Xpert Xpress SARS-CoV-2/FLU/RSV plus assay is intended as an aid ?in the diagnosis of influenza from Nasopharyngeal swab specimens and ?should not be used as a sole basis for treatment. Nasal washings  and ?aspirates are unacceptable for Xpert Xpress SARS-CoV-2/FLU/RSV ?testing. ? ?Fact Sheet for Patients: ?EntrepreneurPulse.com.au ? ?Fact Sheet for Healthcare Providers: ?HurricaneAlarm.si

## 2022-02-06 NOTE — Assessment & Plan Note (Deleted)
Resolved with IV fluids 

## 2022-02-06 NOTE — Hospital Course (Addendum)
26 year old female with medical history significant for anxiety, depression, seizure disorder on Keppra and ADHD, presented with complaints of generalized pains, acute onset of petechial rash of face, upper chest and neck pain and stiffness.  S/p lumbar puncture.  Admitted for suspected asymptomatic meningitis.  Improving from the standpoint.  Course complicated by AKI and hyperkalemia.  Nephrology consulted.  AKI improving.  Course complicated by post lumbar puncture headache, neurology consulted recommended blood patch for CSF leak.  S/p blood patch by IR on 5/8.  Finally headache/neck pain/back pain improving.  Nephrology signed off 5/9.  Hopeful DC home 5/10. ?

## 2022-02-06 NOTE — Assessment & Plan Note (Addendum)
CK 551 on admission.  Unclear etiology. ?Improved, down to 261 on 5/4. ?

## 2022-02-07 DIAGNOSIS — G030 Nonpyogenic meningitis: Secondary | ICD-10-CM

## 2022-02-07 DIAGNOSIS — G039 Meningitis, unspecified: Secondary | ICD-10-CM | POA: Diagnosis not present

## 2022-02-07 DIAGNOSIS — N179 Acute kidney failure, unspecified: Secondary | ICD-10-CM | POA: Diagnosis not present

## 2022-02-07 LAB — BASIC METABOLIC PANEL
Anion gap: 7 (ref 5–15)
BUN: 10 mg/dL (ref 6–20)
CO2: 18 mmol/L — ABNORMAL LOW (ref 22–32)
Calcium: 8 mg/dL — ABNORMAL LOW (ref 8.9–10.3)
Chloride: 112 mmol/L — ABNORMAL HIGH (ref 98–111)
Creatinine, Ser: 1.82 mg/dL — ABNORMAL HIGH (ref 0.44–1.00)
GFR, Estimated: 39 mL/min — ABNORMAL LOW (ref >60)
Glucose, Bld: 106 mg/dL — ABNORMAL HIGH (ref 70–99)
Potassium: 3.7 mmol/L (ref 3.5–5.1)
Sodium: 137 mmol/L (ref 135–145)

## 2022-02-07 LAB — URINALYSIS, COMPLETE (UACMP) WITH MICROSCOPIC
Bilirubin Urine: NEGATIVE
Glucose, UA: NEGATIVE mg/dL
Ketones, ur: NEGATIVE mg/dL
Leukocytes,Ua: NEGATIVE
Nitrite: NEGATIVE
Protein, ur: NEGATIVE mg/dL
Specific Gravity, Urine: 1.005 (ref 1.005–1.030)
pH: 6 (ref 5.0–8.0)

## 2022-02-07 LAB — CBC
HCT: 33.7 % — ABNORMAL LOW (ref 36.0–46.0)
Hemoglobin: 11.4 g/dL — ABNORMAL LOW (ref 12.0–15.0)
MCH: 30.1 pg (ref 26.0–34.0)
MCHC: 33.8 g/dL (ref 30.0–36.0)
MCV: 88.9 fL (ref 80.0–100.0)
Platelets: 156 10*3/uL (ref 150–400)
RBC: 3.79 MIL/uL — ABNORMAL LOW (ref 3.87–5.11)
RDW: 12.1 % (ref 11.5–15.5)
WBC: 11.2 10*3/uL — ABNORMAL HIGH (ref 4.0–10.5)
nRBC: 0 % (ref 0.0–0.2)

## 2022-02-07 LAB — SODIUM, URINE, RANDOM: Sodium, Ur: 56 mmol/L

## 2022-02-07 LAB — EHRLICHIA ANTIBODY PANEL
E chaffeensis (HGE) Ab, IgG: NEGATIVE
E chaffeensis (HGE) Ab, IgM: NEGATIVE
E. Chaffeensis (HME) IgM Titer: NEGATIVE
E.Chaffeensis (HME) IgG: NEGATIVE

## 2022-02-07 LAB — CREATININE, URINE, RANDOM: Creatinine, Urine: 44.2 mg/dL

## 2022-02-07 LAB — VANCOMYCIN, RANDOM: Vancomycin Rm: 11

## 2022-02-07 MED ORDER — PANTOPRAZOLE SODIUM 40 MG PO TBEC
40.0000 mg | DELAYED_RELEASE_TABLET | Freq: Every day | ORAL | Status: DC
  Administered 2022-02-07 – 2022-02-11 (×4): 40 mg via ORAL
  Filled 2022-02-07 (×6): qty 1

## 2022-02-07 NOTE — Progress Notes (Signed)
? ?[facility] Infectious Diseases Follow Up Note ? ?Patient Identification: ?Patient Name: Wendy Vega MRN: [id number] Admit Date: 02/04/2022  5:15 PM ?Age: 26 y.o.Today's Date: 02/07/2022 ? ?Reason for Visit: aseptic meningitis  ? ?Principal Problem: ?  Meningitis ?Active Problems: ?  Acute hyperglycemia ?  Elevated CK ?  Dehydration, mild ?  Anxiety and depression ?  Rash ?  Seizure disorder (HCC) ?  ADHD ?  AKI (acute kidney injury) (HCC) ?  Hypokalemia ? ?Antibiotics:  ?Vancomycin 5/2- ?Ceftriaxone 5/2- ?Acyclovir 5/2- ?  ?Lines/Hardware: ?  ?Interval Events: continues to be afebrile, leukocytosis is down trending  ? ?Assessment ?Aseptic meningitis with petetchial rashes in the face and upper chest  ?- rashes have significantly improved esp in her face with some fading rashes in the rt upper chest area. Her headache and back pain has also improved from 8-9/10 to 5-6/10 although they get worse when she stands. ROM in her neck is also getting better.  ?- 5/3 acute hepatitis panel and HIV NR ?- 5/2 HSV1 and 2 DNA negative, RMSF IGM and IGG negative, RMSF IGM and IGG negative, Lyme EIA negative  ?- 5/2 Ehrlichia serology/ME panel  pending  ?- 5/3 CSF cx negative in 3 days  ?- 5/2 blood cx negative in 3 days  ? ?2. AKI  ?3. Leukocytosis ? ?Recommendations ?DC IV acyclovir as HSV PCR from CSF is negative ?Complete 7 days of doxycycline ?Expect AKI to improve with dc'ing acyclovir and hydration ?Supportive management ?ID available as needed. Please call with questions ? ?Rest of the management as per the primary team. ?Thank you for the consult. Please page with pertinent questions or concerns. ? ?______________________________________________________________________ ?Subjective ?patient seen and examined at the bedside. Mother at bedside.  ?Her headache and back pain has also improved from 8-9/10 to 5-6/10 although they get worse when she stands. ROM in her neck is  also getting better.  ? ?Vitals ?BP (!) 146/95 (BP Location: Left Arm)   Pulse 63   Temp 98.7 ?F (37.1 ?C) (Oral)   Resp 20   Ht 5\' 9"  (1.753 m)   Wt 92.6 kg   LMP 01/23/2022 (Approximate)   SpO2 97%   BMI 30.14 kg/m?  ? ?  ?Physical Exam ?Constitutional:  lying in the bed and clinically looks better ?   Comments: ROM of neck is better ? ?Cardiovascular:  ?   Rate and Rhythm: Normal rate and regular rhythm.  ?   Heart sounds:  ? ?Pulmonary:  ?   Effort: Pulmonary effort is normal.  ?   Comments:  ? ?Abdominal:  ?   Palpations: Abdomen is soft.  ?   Tenderness: non distended  ? ?Musculoskeletal:     ?   General: No swelling or tenderness.  ? ?Skin: ?   Comments: petechial rashes in the face has resolved, fading petechial rashes in the rt upper chest ( improving) ? ?Neurological:  ?   General: Grossly non focal, awake, alert and oriented  ? ?Psychiatric:     ?   Mood and Affect: Mood normal.  ? ?Pertinent Microbiology ?Results for orders placed or performed during the hospital encounter of 02/04/22  ?Resp Panel by RT-PCR (Flu A&B, Covid) Nasopharyngeal Swab     Status: None  ? Collection Time: 02/04/22  6:11 PM  ? Specimen: Nasopharyngeal Swab; Nasopharyngeal(NP) swabs in vial transport medium  ?Result Value Ref Range Status  ? SARS Coronavirus 2 by RT PCR NEGATIVE NEGATIVE Final  ?  Comment: (  NOTE) ?SARS-CoV-2 target nucleic acids are NOT DETECTED. ? ?The SARS-CoV-2 RNA is generally detectable in upper respiratory ?specimens during the acute phase of infection. The lowest ?concentration of SARS-CoV-2 viral copies this assay can detect is ?138 copies/mL. A negative result does not preclude SARS-Cov-2 ?infection and should not be used as the sole basis for treatment or ?other patient management decisions. A negative result may occur with  ?improper specimen collection/handling, submission of specimen other ?than nasopharyngeal swab, presence of viral mutation(s) within the ?areas targeted by this assay, and  inadequate number of viral ?copies(<138 copies/mL). A negative result must be combined with ?clinical observations, patient history, and epidemiological ?information. The expected result is Negative. ? ?Fact Sheet for Patients:  ?BloggerCourse.comhttps://www.fda.gov/media/152166/download ? ?Fact Sheet for Healthcare Providers:  ?SeriousBroker.ithttps://www.fda.gov/media/152162/download ? ?This test is no t yet approved or cleared by the [place] FDA and  ?has been authorized for detection and/or diagnosis of SARS-CoV-2 by ?FDA under an Emergency Use Authorization (EUA). This EUA will remain  ?in effect (meaning this test can be used) for the duration of the ?COVID-19 declaration under Section 564(b)(1) of the Act, 21 ?U.S.C.section 360bbb-3(b)(1), unless the authorization is terminated  ?or revoked sooner.  ? ? ?  ? Influenza A by PCR NEGATIVE NEGATIVE Final  ? Influenza B by PCR NEGATIVE NEGATIVE Final  ?  Comment: (NOTE) ?The Xpert Xpress SARS-CoV-2/FLU/RSV plus assay is intended as an aid ?in the diagnosis of influenza from Nasopharyngeal swab specimens and ?should not be used as a sole basis for treatment. Nasal washings and ?aspirates are unacceptable for Xpert Xpress SARS-CoV-2/FLU/RSV ?testing. ? ?Fact Sheet for Patients: ?BloggerCourse.comhttps://www.fda.gov/media/152166/download ? ?Fact Sheet for Healthcare Providers: ?SeriousBroker.ithttps://www.fda.gov/media/152162/download ? ?This test is not yet approved or cleared by the [place] FDA and ?has been authorized for detection and/or diagnosis of SARS-CoV-2 by ?FDA under an Emergency Use Authorization (EUA). This EUA will remain ?in effect (meaning this test can be used) for the duration of the ?COVID-19 declaration under Section 564(b)(1) of the Act, 21 U.S.C. ?section 360bbb-3(b)(1), unless the authorization is terminated or ?revoked. ? ?Performed at [facility], [address], ?[place], [place] [address] ?  ?Blood culture (routine x 2)     Status: None (Preliminary result)  ? Collection  Time: 02/04/22  6:23 PM  ? Specimen: BLOOD  ?Result Value Ref Range Status  ? Specimen Description   Final  ?  BLOOD RIGHT ANTECUBITAL ?Performed at [facility], [address], [place], [place] [address] ?  ? Special Requests   Final  ?  Blood Culture adequate volume BOTTLES DRAWN AEROBIC AND ANAEROBIC ?Performed at [facility], [address], [place], [place] [address] ?  ? Culture   Final  ?  NO GROWTH 3 DAYS ?Performed at [facility], [address] [place], [place] [address] ?  ? Report Status PENDING  Incomplete  ?Blood culture (routine x 2)     Status: None (Preliminary result)  ? Collection Time: 02/04/22  7:10 PM  ? Specimen: BLOOD  ?Result Value Ref Range Status  ? Specimen Description   Final  ?  BLOOD LEFT ANTECUBITAL ?Performed at [facility], [address], [place], [place] [address] ?  ? Special Requests   Final  ?  BOTTLES DRAWN AEROBIC AND ANAEROBIC Blood Culture adequate volume ?Performed at [facility], [address], [place], [place] [address] ?  ? Culture   Final  ?  NO GROWTH 3 DAYS ?Performed at [facility], [address] [place], [place] [address] ?  ? Report Status PENDING  Incomplete  ?CSF culture     Status: None (Preliminary result)  ? Collection Time: 02/04/22  8:20 PM  ? Specimen: CSF; Cerebrospinal Fluid  ?Result Value Ref Range Status  ? Specimen Description   Final  ?  CSF ?Performed at [facility], [address] [place], [place] [address] ?  ? Special Requests   Final  ?  NONE ?Performed at [facility], [address] [place], [place] [address] ?  ? Gram Stain NO WBC SEEN ?NO ORGANISMS SEEN ?  Final  ? Culture   Final  ?  NO GROWTH 3 DAYS ?Performed at [facility], [address], [place], [place] [address] ?  ? Report Status PENDING  Incomplete  ? ?Pertinent Lab. ? ?  Latest Ref Rng & Units 02/07/2022  ?  3:37 AM 02/06/2022  ?  2:43 AM 02/04/2022   ?  6:11 PM  ?CBC  ?WBC 4.0 - 10.5 K/uL 11.2   12.4   17.1    ?Hemoglobin 12.0 - 15.0 g/dL 32.2   02.5   42.7    ?Hematocrit 36.0 - 46.0 % 33.7   36.3   43.7    ?Platelets 150 - 400 K/uL 156   170   247    ? ? ?

## 2022-02-07 NOTE — TOC Progression Note (Signed)
Transition of Care (TOC) - Progression Note  ? ? ?Patient Details  ?Name: Wendy Vega ?MRN: [id number] ?Date of Birth: [birth year] ? ?Transition of Care (TOC) CM/SW Contact  ?Leone Haven, RN ?Phone Number: ?02/07/2022, 6:35 PM ? ?Clinical Narrative:    ?conts on IV lasix, nephrology consulted cret is up. TOC will continue to follow for dc needs.  ? ? ?  ?  ? ?Expected Discharge Plan and Services ?  ?  ?  ?  ?  ?                ?  ?  ?  ?  ?  ?  ?  ?  ?  ?  ? ? ?Social Determinants of Health (SDOH) Interventions ?  ? ?Readmission Risk Interventions ?   ? View : No data to display.  ?  ?  ?  ? ? ?

## 2022-02-07 NOTE — Plan of Care (Signed)
?  Problem: Elimination: ?Goal: Will not experience complications related to bowel motility ?Outcome: Completed/Met ?  ?Problem: Elimination: ?Goal: Will not experience complications related to urinary retention ?Outcome: Completed/Met ?  ?

## 2022-02-07 NOTE — Consult Note (Signed)
[facility] ?Nephrology Consultation Note ? ?Requesting MD: Marcellus Scott, MD ?Reason for consult: Acute Kidney Injury ? ?HPI:  ?Wendy Vega is a 26 y.o. female with PMH significant for anxiety, depression, and seizure disorder who is currently admitted for suspected aseptic meningitis. Nephrology was consulted for acute kidney injury. ? ?Patient presented to the ED on 5/3 with generalized pain and acute petechial rash on her face and neck. She also endorsed headache and a few sporadic episodes of vomiting. She was noted to have neck pain and stiffness prompting concern for meningitis. Patient was admitted to the hospitalist service and underwent LP which was suggestive of viral meningitis.  ? ?She has no history of renal impairment and has a baseline creatinine of 0.6. Her creatinine increased on hospital day #2 to 1.20 and this morning continued to rise, up to 1.82. ? ?Patient reports she is feeling better overall and her rash has essentially resolved. She notes she is urinating normally. She denies any significant history of NSAID use prior to hospitalization. Since admission she has received Toradol (15mg  x4 doses), Vancomycin (x3 days), and Acyclovir (x4 days), as well as contrast for a CTA head. ? ? ?Creatinine, Ser  ?Date/Time Value Ref Range Status  ?02/07/2022 03:37 AM 1.82 (H) 0.44 - 1.00 mg/dL Final  ?04/09/2022 40/07/2724 AM 1.20 (H) 0.44 - 1.00 mg/dL Final  ?36:64 40/34/7425 AM 0.56 0.44 - 1.00 mg/dL Final  ?95:63 87/56/4332 PM 0.66 0.44 - 1.00 mg/dL Final  ?95:18 84/16/6063 PM 0.63 0.44 - 1.00 mg/dL Final  ?01:60 10/93/2355 PM 0.70 0.44 - 1.00 mg/dL Final  ?73:22 02/54/2706 PM 0.55 0.50 - 1.00 mg/dL Final  ? ? ? ?PMHx: ?  ?Past Medical History:  ?Diagnosis Date  ? Anxiety   ? Depression   ? ? ?Past Surgical History:  ?Procedure Laterality Date  ? left arm surgery     ? ? ?Family Hx:  ?Family History  ?Problem Relation Age of Onset  ? Cancer Other   ? Hyperlipidemia Other   ? ? ?Social  History:  reports that she has never smoked. She has never used smokeless tobacco. She reports current alcohol use. She reports current drug use. Frequency: 7.00 times per week. Drug: Marijuana. ? ?Allergies:  ?Allergies  ?Allergen Reactions  ? Sulfa Antibiotics Other (See Comments)  ?  Lips turn blue and swelling   ? Sulfacetamide Sodium Other (See Comments)  ?  Lips turn blue and swelling   ? ? ?Medications: ?Prior to Admission medications   ?Medication Sig Start Date End Date Taking? Authorizing Provider  ?escitalopram (LEXAPRO) 20 MG tablet TAKE 1 TABLET BY MOUTH DAILY. ?Patient taking differently: Take 20 mg by mouth daily. 12/23/21  Yes Mozingo, 12/25/21, NP  ?ibuprofen (ADVIL) 200 MG tablet Take 400 mg by mouth every 6 (six) hours as needed for headache or cramping.   Yes [provider]  ?levETIRAcetam (KEPPRA) 500 MG tablet Take 500 mg by mouth 2 (two) times daily. 12/31/21  Yes [provider]  ?lisdexamfetamine (VYVANSE) 20 MG capsule Take 40 mg by mouth daily.   Yes [provider]  ?lisdexamfetamine (VYVANSE) 50 MG capsule Take 1 capsule (50 mg total) by mouth daily. ?Patient not taking: Reported on 02/05/2022 12/23/21   Mozingo, 12/25/21, NP  ? ? I have reviewed the patient's current medications. ? ?Labs:  ?Results for orders placed or performed during the hospital encounter of 02/04/22 (from the past 48 hour(s))  ?Rapid urine drug screen (hospital performed)  Status: Abnormal  ? Collection Time: 02/05/22  3:28 PM  ?Result Value Ref Range  ? Opiates POSITIVE (A) NONE DETECTED  ? Cocaine NONE DETECTED NONE DETECTED  ? Benzodiazepines POSITIVE (A) NONE DETECTED  ? Amphetamines NONE DETECTED NONE DETECTED  ? Tetrahydrocannabinol POSITIVE (A) NONE DETECTED  ? Barbiturates NONE DETECTED NONE DETECTED  ?  Comment: (NOTE) ?DRUG SCREEN FOR MEDICAL PURPOSES ?ONLY.  IF CONFIRMATION IS NEEDED ?FOR ANY PURPOSE, NOTIFY LAB ?WITHIN 5 DAYS. ? ?LOWEST DETECTABLE LIMITS ?FOR URINE  DRUG SCREEN ?Drug Class                     Cutoff (ng/mL) ?Amphetamine and metabolites    1000 ?Barbiturate and metabolites    200 ?Benzodiazepine                 200 ?Tricyclics and metabolites     300 ?Opiates and metabolites        300 ?Cocaine and metabolites        300 ?THC                            50 ?Performed at [facility], [address] [place], [place] [id number] ?  ?Hepatitis panel, acute     Status: None  ? Collection Time: 02/05/22  6:59 PM  ?Result Value Ref Range  ? Hepatitis B Surface Ag NON REACTIVE NON REACTIVE  ? HCV Ab NON REACTIVE NON REACTIVE  ?  Comment: (NOTE) ?Nonreactive HCV antibody screen is consistent with no HCV infections,  ?unless recent infection is suspected or other evidence exists to ?indicate HCV infection. ? ?  ? Hep A IgM NON REACTIVE NON REACTIVE  ? Hep B C IgM NON REACTIVE NON REACTIVE  ?  Comment: Performed at [facility], [address] [place], [place] [address]  ?Basic metabolic panel     Status: Abnormal  ? Collection Time: 02/06/22  2:43 AM  ?Result Value Ref Range  ? Sodium 139 135 - 145 mmol/L  ? Potassium 3.4 (L) 3.5 - 5.1 mmol/L  ? Chloride 109 98 - 111 mmol/L  ? CO2 20 (L) 22 - 32 mmol/L  ? Glucose, Bld 99 70 - 99 mg/dL  ?  Comment: Glucose reference range applies only to samples taken after fasting for at least 8 hours.  ? BUN 9 6 - 20 mg/dL  ? Creatinine, Ser 1.20 (H) 0.44 - 1.00 mg/dL  ? Calcium 8.1 (L) 8.9 - 10.3 mg/dL  ? GFR, Estimated >60 >60 mL/min  ?  Comment: (NOTE) ?Calculated using the CKD-EPI Creatinine Equation (2021) ?  ? Anion gap 10 5 - 15  ?  Comment: Performed at [facility], [address] [place], [place] [address]  ?CBC     Status: Abnormal  ? Collection Time: 02/06/22  2:43 AM  ?Result Value Ref Range  ? WBC 12.4 (H) 4.0 - 10.5 K/uL  ? RBC 4.01 3.87 - 5.11 MIL/uL  ? Hemoglobin 12.1 12.0 - 15.0 g/dL  ? HCT 36.3 36.0 - 46.0 %  ? MCV 90.5 80.0 - 100.0 fL  ? MCH 30.2 26.0 - 34.0 pg  ? MCHC 33.3 30.0 - 36.0 g/dL  ? RDW  12.5 11.5 - 15.5 %  ? Platelets 170 150 - 400 K/uL  ? nRBC 0.0 0.0 - 0.2 %  ?  Comment: Performed at [facility], [address] [place], [place] [address]  ?CK     Status: None  ? Collection Time: 02/06/22  2:43 AM  ?Result Value Ref Range  ? Total CK 216 38 - 234 U/L  ?  Comment: Performed at [facility], [address], [place], [place] [address]  ?Basic metabolic panel     Status: Abnormal  ? Collection Time: 02/07/22  3:37 AM  ?Result Value Ref Range  ? Sodium 137 135 - 145 mmol/L  ? Potassium 3.7 3.5 - 5.1 mmol/L  ? Chloride 112 (H) 98 - 111 mmol/L  ? CO2 18 (L) 22 - 32 mmol/L  ? Glucose, Bld 106 (H) 70 - 99 mg/dL  ?  Comment: Glucose reference range applies only to samples taken after fasting for at least 8 hours.  ? BUN 10 6 - 20 mg/dL  ? Creatinine, Ser 1.82 (H) 0.44 - 1.00 mg/dL  ? Calcium 8.0 (L) 8.9 - 10.3 mg/dL  ? GFR, Estimated 39 (L) >60 mL/min  ?  Comment: (NOTE) ?Calculated using the CKD-EPI Creatinine Equation (2021) ?  ? Anion gap 7 5 - 15  ?  Comment: Performed at [facility], [address], [place], [place] [address]  ?CBC     Status: Abnormal  ? Collection Time: 02/07/22  3:37 AM  ?Result Value Ref Range  ? WBC 11.2 (H) 4.0 - 10.5 K/uL  ? RBC 3.79 (L) 3.87 - 5.11 MIL/uL  ? Hemoglobin 11.4 (L) 12.0 - 15.0 g/dL  ? HCT 33.7 (L) 36.0 - 46.0 %  ? MCV 88.9 80.0 - 100.0 fL  ? MCH 30.1 26.0 - 34.0 pg  ? MCHC 33.8 30.0 - 36.0 g/dL  ? RDW 12.1 11.5 - 15.5 %  ? Platelets 156 150 - 400 K/uL  ? nRBC 0.0 0.0 - 0.2 %  ?  Comment: Performed at [facility], [address] [place], [place] [address]  ?Sodium, urine, random     Status: None  ? Collection Time: 02/07/22  8:35 AM  ?Result Value Ref Range  ? Sodium, Ur 56 mmol/L  ?  Comment: Performed at [facility], [address], [place], [place] [address]  ?Creatinine, urine, random     Status: None  ? Collection Time: 02/07/22  8:35 AM  ?Result Value Ref Range  ? Creatinine, Urine 44.20 mg/dL  ?  Comment: Performed at [facility], [address], [place], [place] [address]  ? ? ? ?Physical Exam: ?Vitals:  ? 02/07/22 0840 02/07/22 1205  ?BP: (!) 150/98 (!) 146/95  ?Pulse: 66 63  ?Resp: 18 20  ?Temp: 98.3 ?F (36.8 ?

## 2022-02-07 NOTE — Progress Notes (Signed)
?PROGRESS NOTE ?  ?Wendy Vega  PYY:[id number]    DOB: Aug 25, 1996    DOA: 02/04/2022 ? ?PCP: Lynden Ang, NP  ? ?I have briefly reviewed patients previous medical records in [facility]. ? ?Chief Complaint  ?Patient presents with  ? Torticollis  ? ? ?Hospital Course:  ?26 year old female with medical history significant for anxiety, depression, seizure disorder on Keppra and ADHD, presented with complaints of generalized pains, acute onset of petechial rash of face, upper chest and neck pain and stiffness.  S/p lumbar puncture.  Admitted for suspected meningitis.  ID consulted.  Improving. ? ?Assessment and Plan: ?* Meningitis ?Presentation concern for meningitis. ?Normal CTA of head and neck. ?CSF 42 K RBCs, 114 WBCs, predominantly neutrophilic, total protein 62. ?CSF culture and blood cultures x2: Negative to date. ?Started empirically on IV acyclovir, ceftriaxone and vancomycin. ?Follow serology for Ehrlichia (pending), HSV 1/2 PCR (negative), RMSF (negative).  Lyme's antibody negative.  Serum pregnancy test negative.  HIV and hepatitis panel negative. ?ID consulted and suspecting possible aseptic meningitis with concerns of RMSF given petechial rash.  Discontinued vancomycin and ceftriaxone as CSF studies probably suggest traumatic tap.  Supportive treatment for headache and nausea. ?Skin rash continues to improve. ?Clinically improving. ?Now that HSV 1 and 2 PCR and RMSF serology are negative,?  DC all antimicrobials and airborne isolation.  Await ID input. ? ?AKI (acute kidney injury) (HCC) ?May be related to dehydration from nausea and vomiting, contrast for CTA chest on admission and IV Toradol. ?Discontinued IV Toradol.  Avoid nephrotoxic's. ?Creatinine progressively increasing, up to 1.8.  Nonoliguric. ?FeNA 1.7 suspect CIN. ?Continue supportive care with IV fluids and trend daily BMP.  Avoid nephrotoxic's. ?Nephrology consulted. ? ?Rash ?Continues to steadily improve. ? ?Elevated CK ?CK 551 on  admission.  Unclear etiology. ?Improved, down to 261 on 5/4. ? ?Dehydration, mild ?Resolved with IV fluids. ? ?Seizure disorder (HCC) ?Continue home dose of Keppra.  Seizure precautions. ?Keppra level low, however no change in dose. ? ?Anxiety and depression ?Continue home Lexapro and Vyvanse. ? ?Acute hyperglycemia ?Suspected due to a dose of Decadron in ED. ?FBS on a.m. labs 99. ? ?Hypokalemia ?Replaced. ? ? ?Body mass index is 30.14 kg/m?. ? ? ?DVT prophylaxis: enoxaparin (LOVENOX) injection 40 mg Start: 02/05/22 0815   ?  Code Status: Full Code:  ?Family Communication: Mother at bedside. ?Disposition:  ?Status is: Inpatient ?Remains inpatient appropriate because: IV antimicrobials ?  ? ?Consultants:   ?Infectious disease ? ?Procedures:   ?LP ? ?Antimicrobials:   ?As above ? ? ?Subjective:  ?Interviewed and examined along with her mother and her female Charity fundraiser at bedside.  Overall feels better.  Headache, neck pain and back pain (midline from mid to lower back) down from 8/10 to 6/10.  Headache, worse in upright position.  No further nausea or vomiting since yesterday morning.  However with poor appetite and does not like hospital food.  Drink some nutritional shakes.  Urinating frequently.  Skin rash subsiding and as per mother who showed pictures on her smart phone, rash improved more than 50% since admission. ? ?Objective:  ? ?Vitals:  ? 02/06/22 2000 02/06/22 2304 02/07/22 0522 02/07/22 0840  ?BP: 135/90 (!) 120/52 (!) 141/89 (!) 150/98  ?Pulse: 61 69 81 66  ?Resp: 20  20 18   ?Temp: 98 ?F (36.7 ?C)  98 ?F (36.7 ?C) 98.3 ?F (36.8 ?C)  ?TempSrc: Oral  Oral Axillary  ?SpO2: 100%  98% 100%  ?Weight:   92.6 kg   ?  Height:      ? ? ?General exam: Young female, moderately built and overweight lying comfortably propped up in bed without distress.  Does not look septic or toxic.  Oral mucosa moist. ?Neck: Mildly restricted flexion and lateral movements but improved compared to yesterday. ?Respiratory system: Clear to  auscultation. Respiratory effort normal. ?Cardiovascular system: S1 & S2 heard, RRR. No JVD, murmurs, rubs, gallops or clicks. No pedal edema. ?Gastrointestinal system: Abdomen is nondistended, soft and nontender. No organomegaly or masses felt. Normal bowel sounds heard. ?Central nervous system: Alert and oriented. No focal neurological deficits. ?Extremities: Symmetric 5 x 5 power. ?Skin: Facial rash much improved compared to pictures noted on mother's phone.  Minimal rash right base of the neck, almost resolved) and left upper anterior chest (resolved).  No acute findings on back exam.  LP site without acute findings. ?Psychiatry: Judgement and insight appear normal. Mood & affect appropriate.  ? ?Data Reviewed:   ?I have personally reviewed following labs and imaging studies ? ?CBC: ?Recent Labs  ?Lab 02/04/22 ?1811 02/06/22 ?0243 02/07/22 ?1829  ?WBC 17.1* 12.4* 11.2*  ?NEUTROABS 14.9*  --   --   ?HGB 14.4 12.1 11.4*  ?HCT 43.7 36.3 33.7*  ?MCV 89.0 90.5 88.9  ?PLT 247 170 156  ? ?Basic Metabolic Panel: ?Recent Labs  ?Lab 02/04/22 ?1811 02/05/22 ?9371 02/06/22 ?0243 02/07/22 ?6967  ?NA 138 136 139 137  ?K 3.8 4.1 3.4* 3.7  ?CL 106 107 109 112*  ?CO2 21* 19* 20* 18*  ?GLUCOSE 112* 231* 99 106*  ?BUN 12 7 9 10   ?CREATININE 0.66 0.56 1.20* 1.82*  ?CALCIUM 9.2 8.5* 8.1* 8.0*  ? ?Liver Function Tests: ?Recent Labs  ?Lab 02/04/22 ?1811  ?AST 20  ?ALT 15  ?ALKPHOS 62  ?BILITOT 0.3  ?PROT 7.5  ?ALBUMIN 4.6  ? ?CBG: ?No results for input(s): GLUCAP in the last 168 hours. ?Microbiology Studies:  ? ?Recent Results (from the past 240 hour(s))  ?Resp Panel by RT-PCR (Flu A&B, Covid) Nasopharyngeal Swab     Status: None  ? Collection Time: 02/04/22  6:11 PM  ? Specimen: Nasopharyngeal Swab; Nasopharyngeal(NP) swabs in vial transport medium  ?Result Value Ref Range Status  ? SARS Coronavirus 2 by RT PCR NEGATIVE NEGATIVE Final  ?  Comment: (NOTE) ?SARS-CoV-2 target nucleic acids are NOT DETECTED. ? ?The SARS-CoV-2 RNA is  generally detectable in upper respiratory ?specimens during the acute phase of infection. The lowest ?concentration of SARS-CoV-2 viral copies this assay can detect is ?138 copies/mL. A negative result does not preclude SARS-Cov-2 ?infection and should not be used as the sole basis for treatment or ?other patient management decisions. A negative result may occur with  ?improper specimen collection/handling, submission of specimen other ?than nasopharyngeal swab, presence of viral mutation(s) within the ?areas targeted by this assay, and inadequate number of viral ?copies(<138 copies/mL). A negative result must be combined with ?clinical observations, patient history, and epidemiological ?information. The expected result is Negative. ? ?Fact Sheet for Patients:  ?04/06/22 ? ?Fact Sheet for Healthcare Providers:  ?BloggerCourse.com ? ?This test is no t yet approved or cleared by the [place] FDA and  ?has been authorized for detection and/or diagnosis of SARS-CoV-2 by ?FDA under an Emergency Use Authorization (EUA). This EUA will remain  ?in effect (meaning this test can be used) for the duration of the ?COVID-19 declaration under Section 564(b)(1) of the Act, 21 ?U.S.C.section 360bbb-3(b)(1), unless the authorization is terminated  ?or revoked sooner.  ? ? ?  ?  Influenza A by PCR NEGATIVE NEGATIVE Final  ? Influenza B by PCR NEGATIVE NEGATIVE Final  ?  Comment: (NOTE) ?The Xpert Xpress SARS-CoV-2/FLU/RSV plus assay is intended as an aid ?in the diagnosis of influenza from Nasopharyngeal swab specimens and ?should not be used as a sole basis for treatment. Nasal washings and ?aspirates are unacceptable for Xpert Xpress SARS-CoV-2/FLU/RSV ?testing. ? ?Fact Sheet for Patients: ?BloggerCourse.comhttps://www.fda.gov/media/152166/download ? ?Fact Sheet for Healthcare Providers: ?SeriousBroker.ithttps://www.fda.gov/media/152162/download ? ?This test is not yet approved or cleared by the [place] FDA and ?has been authorized for detection and/or diagnosis of SARS-CoV-2 by ?FDA under an Emergency Use Authorization (EUA). This EUA will remain ?in effect (meaning this test can be used) for the duratio

## 2022-02-08 ENCOUNTER — Inpatient Hospital Stay (HOSPITAL_COMMUNITY): Payer: Commercial Managed Care - PPO

## 2022-02-08 DIAGNOSIS — N179 Acute kidney failure, unspecified: Secondary | ICD-10-CM | POA: Diagnosis not present

## 2022-02-08 DIAGNOSIS — E875 Hyperkalemia: Secondary | ICD-10-CM

## 2022-02-08 DIAGNOSIS — E872 Acidosis, unspecified: Secondary | ICD-10-CM | POA: Diagnosis not present

## 2022-02-08 HISTORY — DX: Acidosis, unspecified: E87.20

## 2022-02-08 HISTORY — DX: Hyperkalemia: E87.5

## 2022-02-08 LAB — BASIC METABOLIC PANEL
Anion gap: 9 (ref 5–15)
BUN: 13 mg/dL (ref 6–20)
CO2: 15 mmol/L — ABNORMAL LOW (ref 22–32)
Calcium: 8.8 mg/dL — ABNORMAL LOW (ref 8.9–10.3)
Chloride: 115 mmol/L — ABNORMAL HIGH (ref 98–111)
Creatinine, Ser: 1.85 mg/dL — ABNORMAL HIGH (ref 0.44–1.00)
GFR, Estimated: 38 mL/min — ABNORMAL LOW (ref >60)
Glucose, Bld: 93 mg/dL (ref 70–99)
Potassium: 5.8 mmol/L — ABNORMAL HIGH (ref 3.5–5.1)
Sodium: 139 mmol/L (ref 135–145)

## 2022-02-08 LAB — HSV DNA BY PCR (REFERENCE LAB)
HSV 1 DNA: NEGATIVE
HSV 2 DNA: NEGATIVE

## 2022-02-08 LAB — CSF CULTURE W GRAM STAIN
Culture: NO GROWTH
Gram Stain: NONE SEEN

## 2022-02-08 MED ORDER — PATIROMER SORBITEX CALCIUM 8.4 G PO PACK
16.8000 g | PACK | Freq: Every day | ORAL | Status: DC
  Administered 2022-02-08: 16.8 g via ORAL
  Filled 2022-02-08 (×2): qty 2

## 2022-02-08 MED ORDER — SODIUM BICARBONATE 8.4 % IV SOLN
INTRAVENOUS | Status: AC
  Filled 2022-02-08: qty 1000

## 2022-02-08 MED ORDER — MORPHINE SULFATE (PF) 4 MG/ML IV SOLN
4.0000 mg | Freq: Once | INTRAVENOUS | Status: AC
  Administered 2022-02-08: 4 mg via INTRAVENOUS
  Filled 2022-02-08: qty 1

## 2022-02-08 MED ORDER — DIPHENHYDRAMINE HCL 50 MG/ML IJ SOLN
50.0000 mg | Freq: Once | INTRAMUSCULAR | Status: AC
  Administered 2022-02-08: 50 mg via INTRAVENOUS
  Filled 2022-02-08: qty 1

## 2022-02-08 MED ORDER — DEXTROSE-NACL 5-0.9 % IV SOLN: INTRAVENOUS | Status: DC

## 2022-02-08 MED ORDER — LIDOCAINE 5 % EX PTCH
1.0000 | MEDICATED_PATCH | CUTANEOUS | Status: DC
  Administered 2022-02-08 – 2022-02-12 (×5): 1 via TRANSDERMAL
  Filled 2022-02-08 (×5): qty 1

## 2022-02-08 MED ORDER — METOCLOPRAMIDE HCL 5 MG/ML IJ SOLN
10.0000 mg | Freq: Once | INTRAMUSCULAR | Status: AC
  Administered 2022-02-08: 10 mg via INTRAVENOUS
  Filled 2022-02-08: qty 2

## 2022-02-08 NOTE — Assessment & Plan Note (Addendum)
Unclear etiology. HCO3: 15 on 5/6 ??  Secondary to AKI and GI losses from vomiting. ?Persisting non-anion gap metabolic acidosis with bicarbonate in the 16-17 range. ?Nephrology has started oral bicarbonate supplements.  Outpatient monitoring. ?

## 2022-02-08 NOTE — Progress Notes (Signed)
?PROGRESS NOTE ?  ?Wendy Vega  [id number]    DOB: [birth year]    DOA: 02/04/2022 ? ?PCP: Avon Gully, NP  ? ?I have briefly reviewed patients previous medical records in [facility]. ? ?Chief Complaint  ?Patient presents with  ? Torticollis  ? ? ?Hospital Course:  ?26 year old female with medical history significant for anxiety, depression, seizure disorder on Keppra and ADHD, presented with complaints of generalized pains, acute onset of petechial rash of face, upper chest and neck pain and stiffness.  S/p lumbar puncture.  Admitted for suspected asymptomatic meningitis.  Improving from the standpoint.  Course complicated by AKI and hyperkalemia.  Nephrology consulted. ? ?Assessment and Plan: ?* Meningitis ?Presentation concern for meningitis. ?Normal CTA of head and neck. ?CSF 42 K RBCs, 114 WBCs, predominantly neutrophilic, total protein 62. ?CSF culture and blood cultures x2: Negative to date. ?Started empirically on IV acyclovir, ceftriaxone and vancomycin. ?Follow serology for Ehrlichia (negative), HSV 1/2 PCR (negative), RMSF (negative).  Lyme's antibody negative.  Serum pregnancy test negative.  HIV and hepatitis panel negative. ?ID consulted and suspecting possible aseptic meningitis with concerns of RMSF given petechial rash.   ?Discontinued vancomycin and ceftriaxone as CSF studies probably suggest traumatic tap.   ?Skin rash continues to improve. ?Clinically improving. ?HSV 1 and 2 PCR and RMSF serology are negative ?As per ID signoff 5/5: Acyclovir discontinued, recommend completing 7 days of doxycycline.  All isolation discontinued. ?Continues to have some headache, back pain, worse with upright position.  However much improved compared to couple days ago.  Some of this may be related to spinal headache. ?Added topical lidocaine patch to the back. ? ?AKI (acute kidney injury) ([place]) ?Likely multifactorial: Dehydration from nausea and vomiting, ATN from contrast for CTA chest on admission  and IV Toradol.  Vancomycin level normal. ?Discontinued IV Toradol.  Avoid nephrotoxic's. ?Creatinine progressively increasing, up to 1.8 but plateaued and at 1.8 today.  Nonoliguric. ?FeNA 1.7. ?Nephrology following.  Due to metabolic acidosis and hyperkalemia, initiating IV bicarb drip especially given inconsistent and poor oral intake. ? ?Hyperkalemia ?May be due to AKI. K 5.8 on 5/6 ?Phlebotomy team had quite difficulty and patient had to be stuck multiple times for lab draws but no mention of hemolysis on results. ?Getting a dose of Veltassa and initiating bicarbonate drip. ?Follow BMP in AM. ? ?Metabolic acidosis, normal anion gap (NAG) ?Unclear etiology. HCO3: 15 on 5/6 ??  Secondary to AKI and GI losses from vomiting. ?Initiating bicarbonate drip. ? ?Elevated CK-resolved as of 02/08/2022 ?CK 551 on admission.  Unclear etiology. ?Improved, down to 261 on 5/4. ? ?Rash ?Continues to steadily improve. ? ?Dehydration, mild-resolved as of 02/08/2022 ?Resolved with IV fluids. ? ?Seizure disorder ([place]) ?Continue home dose of Keppra.  Seizure precautions. ?Keppra level low, however no change in dose. ? ?Anxiety and depression ?Continue home Lexapro and Vyvanse. ? ?Acute hyperglycemia-resolved as of 02/08/2022 ?Suspected due to a dose of Decadron in ED. ?FBS on a.m. labs 99. ? ?Hypokalemia-resolved as of 02/08/2022 ?Replaced. ? ? ?Body mass index is 29.83 kg/m?. ? ? ?DVT prophylaxis: enoxaparin (LOVENOX) injection 40 mg Start: 02/05/22 0815   ?  Code Status: Full Code:  ?Family Communication: Sister at bedside. ?Disposition:  ?Status is: Inpatient ?Remains inpatient appropriate because: IV fluids ?  ? ?Consultants:   ?Infectious disease ?Nephrology ? ?Procedures:   ?LP ? ?Antimicrobials:   ?As above ? ? ?Subjective:  ?Ongoing poor and inconsistent p.o. intake.  Had some protein shake and something  to eat yesterday.  Did have an episode of vomiting yesterday.  Ongoing headache, mid/lower back pain, worse with upright position,  much better compared to admission.  Rash continues to improve. ? ?Objective:  ? ?Vitals:  ? 02/07/22 2220 02/08/22 0200 02/08/22 0645 02/08/22 0931  ?BP: 133/74 (!) 143/85 134/81 135/87  ?Pulse: (!) 57 (!) 49 (!) 53   ?Resp: 18 16 17    ?Temp: 97.9 ?F (36.6 ?C) 97.9 ?F (36.6 ?C) 98.1 ?F (36.7 ?C)   ?TempSrc: Oral Oral Oral   ?SpO2: 97% 99% 98%   ?Weight:   91.6 kg   ?Height:      ? ? ?General exam: Young female, moderately built and overweight lying comfortably propped up in bed without distress.  Does not look septic or toxic.  Oral mucosa borderline hydration. ?Neck: Mildly restricted flexion and lateral movements. ?Respiratory system: Clear to auscultation. Respiratory effort normal. ?Cardiovascular system: S1 & S2 heard, RRR. No JVD, murmurs, rubs, gallops or clicks. No pedal edema.  Not on telemetry. ?Gastrointestinal system: Abdomen is nondistended, soft and nontender. No organomegaly or masses felt. Normal bowel sounds heard. ?Central nervous system: Alert and oriented. No focal neurological deficits. ?Extremities: Symmetric 5 x 5 power. ?Skin: Facial, right side of base of neck and left upper anterior chest rash have resolved.  LP site without acute findings.  No significant tenderness on spinal area palpation ?Psychiatry: Judgement and insight appear normal. Mood & affect appropriate.  ? ?Data Reviewed:   ?I have personally reviewed following labs and imaging studies ? ?CBC: ?Recent Labs  ?Lab 02/04/22 ?1811 02/06/22 ?0243 02/07/22 ?0337  ?WBC 17.1* 12.4* 11.2*  ?NEUTROABS 14.9*  --   --   ?HGB 14.4 12.1 11.4*  ?HCT 43.7 36.3 33.7*  ?MCV 89.0 90.5 88.9  ?PLT 247 170 156  ? ?Basic Metabolic Panel: ?Recent Labs  ?Lab 02/04/22 ?1811 02/05/22 ?[id number] 02/06/22 ?0243 02/07/22 ?[id number] 02/08/22 ?[id number]  ?NA 138 136 139 137 139  ?K 3.8 4.1 3.4* 3.7 5.8*  ?CL 106 107 109 112* 115*  ?CO2 21* 19* 20* 18* 15*  ?GLUCOSE 112* 231* 99 106* 93  ?BUN 12 7 9 10 13   ?CREATININE 0.66 0.56 1.20* 1.82* 1.85*  ?CALCIUM 9.2 8.5* 8.1* 8.0*  8.8*  ? ?Liver Function Tests: ?Recent Labs  ?Lab 02/04/22 ?1811  ?AST 20  ?ALT 15  ?ALKPHOS 62  ?BILITOT 0.3  ?PROT 7.5  ?ALBUMIN 4.6  ? ?CBG: ?No results for input(s): GLUCAP in the last 168 hours. ?Microbiology Studies:  ? ?Recent Results (from the past 240 hour(s))  ?Resp Panel by RT-PCR (Flu A&B, Covid) Nasopharyngeal Swab     Status: None  ? Collection Time: 02/04/22  6:11 PM  ? Specimen: Nasopharyngeal Swab; Nasopharyngeal(NP) swabs in vial transport medium  ?Result Value Ref Range Status  ? SARS Coronavirus 2 by RT PCR NEGATIVE NEGATIVE Final  ?  Comment: (NOTE) ?SARS-CoV-2 target nucleic acids are NOT DETECTED. ? ?The SARS-CoV-2 RNA is generally detectable in upper respiratory ?specimens during the acute phase of infection. The lowest ?concentration of SARS-CoV-2 viral copies this assay can detect is ?138 copies/mL. A negative result does not preclude SARS-Cov-2 ?infection and should not be used as the sole basis for treatment or ?other patient management decisions. A negative result may occur with  ?improper specimen collection/handling, submission of specimen other ?than nasopharyngeal swab, presence of viral mutation(s) within the ?areas targeted by this assay, and inadequate number of viral ?copies(<138 copies/mL). A negative result must be combined with ?  clinical observations, patient history, and epidemiological ?information. The expected result is Negative. ? ?Fact Sheet for Patients:  ?EntrepreneurPulse.com.au ? ?Fact Sheet for Healthcare Providers:  ?IncredibleEmployment.be ? ?This test is no t yet approved or cleared by the [place] FDA and  ?has been authorized for detection and/or diagnosis of SARS-CoV-2 by ?FDA under an Emergency Use Authorization (EUA). This EUA will remain  ?in effect (meaning this test can be used) for the duration of the ?COVID-19 declaration under Section 564(b)(1) of the Act, 21 ?U.S.C.section 360bbb-3(b)(1), unless the  authorization is terminated  ?or revoked sooner.  ? ? ?  ? Influenza A by PCR NEGATIVE NEGATIVE Final  ? Influenza B by PCR NEGATIVE NEGATIVE Final  ?  Comment: (NOTE) ?The Xpert Xpress SARS-CoV-2/FLU/RSV plus a

## 2022-02-08 NOTE — Progress Notes (Signed)
Patient initially complaining of severe head pain at approximately 1950 after ambulating to and from bathroom. Morphine 2mg  IV given per orders, 30 mins after administration pt still c/o severe head pain 8/10 with no relief from Morphine. Dr. 10/10 paged in regards. Patient stating worst pain she's head since illness began. Pain spreading to face and downwards anteriorly. Morphine 4mg  was ordered and given. Patient still without relief. Patient declining pills at this time due to severe unrelieved pain but will attempt to administer pills when pain better managed. MD informed of red med refusal.  ? ?Spoke to MD about unrelieved pain from additional Morphine dose. Patient requesting alternative medication. MD informed and a STAT CT headed. Will inform MD of results ASAP. RN called CT for pt to come down and transport took patient to imaging.Upon return from CT patient vomiting profusely. MD informed.  ? ?-- ? ?CT results given to MD ? ?Orders received for treatment. Per MD okay to tell CT results to patient/family. IV medications given to patient will monitor for effectiveness.  ?

## 2022-02-08 NOTE — Progress Notes (Addendum)
HOSPITAL MEDICINE OVERNIGHT EVENT NOTE   ? ?Notified by nursing that patient has been complaining about rapidly increasing intensity in her headache.  Previously ordered standing doses of 2 mg of intravenous morphine have not been effective.  ? ? According to nursing, patient is neurologically intact without any focal deficit.  Patient is at baseline mentation.  Patient denies any history of migraines.  BP 148/92 without tachycardia.   In the absence of any new neurologic symptoms I do not believe any new imaging is indicated. ? ?Patient has been here for several days and therefore the significant worsening in her headache is not consistent with her improving clinical course.  Unable to give Toradol /NSAIDS due to creatinine.  We will give a one-time dose of 4 mg of intravenous morphine and continue to monitor. ? ?Vernelle Emerald  MD ?[facility]  ? ?ADDENDUM 5/6 10pm ? ?Patient continues to complain of severe headache despite administration of morphine.  In the absence of neurologic symptoms typically imaging would not be indicated however due to the degree of pain she is complaining of we will obtain a noncontrast stat CT of the head to ensure there is no evidence of hemorrhage albeit unlikely. ? ?Sherryll Burger Garner Dullea ? ?ADDENDUM 5/6 1045pm ? ?CT head without contrast reveals no evidence of hemorrhage.  Patient did continues to complain of a surprising intensity of headache.  Once again, patient denies migraines however I feel it would be prudent to try a trial regimen of intravenous Reglan and Benadryl to see if patient can achieve symptomatic relief. ? ?Sherryll Burger Aashna Matson ? ? ? ? ? ? ? ? ? ? ? ?

## 2022-02-08 NOTE — Progress Notes (Signed)
Spoke to CT to request transport for pt for STAT CT head.  ?

## 2022-02-08 NOTE — Assessment & Plan Note (Addendum)
Due to AKI and poor clearance and replacement. ?Intermittent.  Resolved after doses of Veltassa and Lokelma. ?

## 2022-02-08 NOTE — Progress Notes (Signed)
Patient ID: CARSYNN LEAPHART, female   DOB: 30-Dec-1995, 26 y.o.   MRN: [id number] ? [facility] ?Progress Note  ? ?Assessment/ Plan:   ?1. Acute kidney Injury: Suspected to be hemodynamically mediated +/- evolution to ATN from NSAID/contrast use.  Nonoliguric overnight and will restart intravenous fluids after challenges with oral intake reported overnight.  Renal function essentially unchanged again supportive more of possible evolution to ATN. ?2.  Hyperkalemia: Secondary to excessive intake in the setting of acute kidney injury, will treat with a single dose of Veltassa and begin treatment of her metabolic acidosis with bicarbonate drip. ?3.  Non-anion gap metabolic acidosis: Poorly explained etiology in the setting of acute kidney injury, begin sodium bicarbonate drip. ?4.  Aseptic meningitis: Earlier work-up negative and to complete doxycycline for total treatment duration of 7 days. ? ?Subjective:   ?Complains of poor appetite/nausea overnight.  Has been drinking smoothies for nutrition yesterday/overnight.  ? ?Objective:   ?BP 134/81 (BP Location: Right Arm)   Pulse (!) 53   Temp 98.1 ?F (36.7 ?C) (Oral)   Resp 17   Ht 5\' 9"  (1.753 m)   Wt 91.6 kg   LMP 01/23/2022 (Approximate)   SpO2 98%   BMI 29.83 kg/m?  ? ?Intake/Output Summary (Last 24 hours) at 02/08/2022 0850 ?Last data filed at 02/07/2022 1800 ?Gross per 24 hour  ?Intake 640 ml  ?Output 700 ml  ?Net -60 ml  ? ?Weight change: -0.953 kg ? ?Physical Exam: ?Gen: Comfortably resting in bed ?CVS: Pulse regular rhythm, bradycardia, S1 and S2 normal ?Resp: Clear to auscultation bilaterally, no rales/rhonchi ?Abd: Soft, flat, nontender, bowel sounds normal ?Ext: No lower extremity edema ? ?Imaging: ?No results found. ? ?Labs: ?BMET ?Recent Labs  ?Lab 02/04/22 ?1811 02/05/22 ?[id number] 02/06/22 ?0243 02/07/22 ?[id number] 02/08/22 ?[id number]  ?NA 138 136 139 137 139  ?K 3.8 4.1 3.4* 3.7 5.8*  ?CL 106 107 109 112* 115*  ?CO2 21* 19* 20* 18* 15*  ?GLUCOSE 112* 231*  99 106* 93  ?BUN 12 7 9 10 13   ?CREATININE 0.66 0.56 1.20* 1.82* 1.85*  ?CALCIUM 9.2 8.5* 8.1* 8.0* 8.8*  ? ?CBC ?Recent Labs  ?Lab 02/04/22 ?1811 02/06/22 ?0243 02/07/22 ?0337  ?WBC 17.1* 12.4* 11.2*  ?NEUTROABS 14.9*  --   --   ?HGB 14.4 12.1 11.4*  ?HCT 43.7 36.3 33.7*  ?MCV 89.0 90.5 88.9  ?PLT 247 170 156  ? ?Medications:   ? ? doxycycline  100 mg Oral Q12H  ? enoxaparin (LOVENOX) injection  40 mg Subcutaneous Q24H  ? escitalopram  20 mg Oral Daily  ? levETIRAcetam  500 mg Oral BID  ? lidocaine  1 patch Transdermal Q24H  ? lisdexamfetamine  20 mg Oral Daily  ? pantoprazole  40 mg Oral QHS  ? patiromer  16.8 g Oral Daily  ? sodium chloride flush  3 mL Intravenous Q12H  ? ?Elmarie Shiley, MD ?02/08/2022, 8:50 AM  ? ?

## 2022-02-09 DIAGNOSIS — N179 Acute kidney failure, unspecified: Secondary | ICD-10-CM | POA: Diagnosis not present

## 2022-02-09 DIAGNOSIS — G039 Meningitis, unspecified: Secondary | ICD-10-CM | POA: Diagnosis not present

## 2022-02-09 DIAGNOSIS — E872 Acidosis, unspecified: Secondary | ICD-10-CM | POA: Diagnosis not present

## 2022-02-09 DIAGNOSIS — E875 Hyperkalemia: Secondary | ICD-10-CM | POA: Diagnosis not present

## 2022-02-09 LAB — CULTURE, BLOOD (ROUTINE X 2)
Culture: NO GROWTH
Culture: NO GROWTH
Special Requests: ADEQUATE
Special Requests: ADEQUATE

## 2022-02-09 LAB — RENAL FUNCTION PANEL
Albumin: 2.8 g/dL — ABNORMAL LOW (ref 3.5–5.0)
Anion gap: 5 (ref 5–15)
BUN: 10 mg/dL (ref 6–20)
CO2: 22 mmol/L (ref 22–32)
Calcium: 8.2 mg/dL — ABNORMAL LOW (ref 8.9–10.3)
Chloride: 112 mmol/L — ABNORMAL HIGH (ref 98–111)
Creatinine, Ser: 1.69 mg/dL — ABNORMAL HIGH (ref 0.44–1.00)
GFR, Estimated: 43 mL/min — ABNORMAL LOW (ref >60)
Glucose, Bld: 109 mg/dL — ABNORMAL HIGH (ref 70–99)
Phosphorus: 3.4 mg/dL (ref 2.5–4.6)
Potassium: 4 mmol/L (ref 3.5–5.1)
Sodium: 139 mmol/L (ref 135–145)

## 2022-02-09 LAB — CBC
HCT: 33.1 % — ABNORMAL LOW (ref 36.0–46.0)
Hemoglobin: 11.6 g/dL — ABNORMAL LOW (ref 12.0–15.0)
MCH: 30.8 pg (ref 26.0–34.0)
MCHC: 35 g/dL (ref 30.0–36.0)
MCV: 87.8 fL (ref 80.0–100.0)
Platelets: 180 10*3/uL (ref 150–400)
RBC: 3.77 MIL/uL — ABNORMAL LOW (ref 3.87–5.11)
RDW: 11.9 % (ref 11.5–15.5)
WBC: 10.5 10*3/uL (ref 4.0–10.5)
nRBC: 0 % (ref 0.0–0.2)

## 2022-02-09 MED ORDER — SODIUM CHLORIDE 0.9 % IV SOLN
12.5000 mg | Freq: Four times a day (QID) | INTRAVENOUS | Status: DC | PRN
  Administered 2022-02-09: 12.5 mg via INTRAVENOUS
  Filled 2022-02-09 (×2): qty 0.5

## 2022-02-09 MED ORDER — OXYCODONE HCL 5 MG PO TABS
5.0000 mg | ORAL_TABLET | Freq: Four times a day (QID) | ORAL | Status: DC | PRN
  Administered 2022-02-09 (×2): 5 mg via ORAL
  Filled 2022-02-09 (×2): qty 1

## 2022-02-09 MED ORDER — HYDROMORPHONE HCL 1 MG/ML IJ SOLN
1.0000 mg | Freq: Once | INTRAMUSCULAR | Status: AC
Start: 2022-02-10 — End: 2022-02-09
  Administered 2022-02-09: 1 mg via INTRAVENOUS

## 2022-02-09 MED ORDER — HYDROMORPHONE HCL 1 MG/ML IJ SOLN
0.5000 mg | INTRAMUSCULAR | Status: DC | PRN
  Administered 2022-02-09 (×2): 0.5 mg via INTRAVENOUS
  Administered 2022-02-10: 1 mg via INTRAVENOUS
  Filled 2022-02-09: qty 1
  Filled 2022-02-09: qty 0.5
  Filled 2022-02-09 (×2): qty 1

## 2022-02-09 NOTE — Consult Note (Addendum)
Neurology Consultation ? ?Reason for Consult: persistent headache  ?Referring Physician: Dr. Waymon Amato  ? ?CC: Torticollis ? ?History is obtained from: patient and medical record  ? ?HPI: Wendy Vega is a 26 y.o. female with past medical history of anxiety and depression, seizure disorder and ADHD who presented to the [facility] ED on 5/2 for acute onset of severe neck pain, chills, body aches and a rash on upper chest and face.  Symptoms started upon waking up from a nap around 11 AM.  States she felt well prior to this.  She has severe pain in her neck with difficulty moving it from side to side, having chills and aches but no fever.  Noticed the rash to the right side of her neck and upper chest.  Denies any trauma but did have 1 episode of vomiting yesterday.  She denies headache but does have some photophobia. LP was done in the ED that revealed bloody fluid but otherwise was not consistent with bacterial meningitis.  Total protein was 62 in context of blood, glucose was 70 and tube 1 of WBCs 5114 and tube 4 was 54. She had been given doses of empiric vancomycin, acyclovir and Rocephin.  ?She has had a headache off and on since admission. Last pm she had the "worse headache of her life". She was given multiple doses of morphine with no relief and then was given Reglan and benadryl after which the headache subsided some. CTH with no acute abnormality. MOM at bedside  ?Patient with positional headache that becomes worse when sitting up from laying position, prefers to be laying down since this is what helps her headaches, does endorse some photophobia. Just had some vomiting when changing position, pain is 4/10 currently. Neurology consulted for assistance  ? ? ?ROS: Full ROS was performed and is negative except as noted in the HPI.   ? ?Past Medical History:  ?Diagnosis Date  ? Anxiety   ? Depression   ? ? ?Family History  ?Problem Relation Age of Onset  ? Cancer Other   ? Hyperlipidemia Other    ? ? ? ?Social History:  ? reports that she has never smoked. She has never used smokeless tobacco. She reports current alcohol use. She reports current drug use. Frequency: 7.00 times per week. Drug: Marijuana. ? ?Medications ? ?Current Facility-Administered Medications:  ?  acetaminophen (TYLENOL) tablet 650 mg, 650 mg, Oral, Q6H PRN, 650 mg at 02/09/22 0935 **OR** acetaminophen (TYLENOL) suppository 650 mg, 650 mg, Rectal, Q6H PRN, Russella Dar, NP ?  albuterol (PROVENTIL) (2.5 MG/3ML) 0.083% nebulizer solution 2.5 mg, 2.5 mg, Nebulization, Q6H PRN, Carollee Herter, DO ?  dextrose 5 %-0.9 % sodium chloride infusion, , Intravenous, Continuous, Zetta Bills, MD, Last Rate: 125 mL/hr at 02/09/22 0859, New Bag at 02/09/22 0859 ?  doxycycline (VIBRA-TABS) tablet 100 mg, 100 mg, Oral, Q12H, Odette Fraction, MD, 100 mg at 02/09/22 0854 ?  enoxaparin (LOVENOX) injection 40 mg, 40 mg, Subcutaneous, Q24H, Russella Dar, NP, 40 mg at 02/09/22 0855 ?  escitalopram (LEXAPRO) tablet 20 mg, 20 mg, Oral, Daily, Junious Silk L, NP, 20 mg at 02/09/22 0854 ?  HYDROmorphone (DILAUDID) injection 0.5-1 mg, 0.5-1 mg, Intravenous, Q4H PRN, Hongalgi, Anand D, MD ?  levETIRAcetam (KEPPRA) tablet 500 mg, 500 mg, Oral, BID, Russella Dar, NP, 500 mg at 02/09/22 0854 ?  lidocaine (LIDODERM) 5 % 1 patch, 1 patch, Transdermal, Q24H, Hongalgi, Maximino Greenland, MD, 1 patch at 02/09/22 0855 ?  lisdexamfetamine (  VYVANSE) capsule 20 mg, 20 mg, Oral, Daily, Katrinka Blazing, Rondell A, MD, 20 mg at 02/09/22 0854 ?  ondansetron (ZOFRAN) tablet 4 mg, 4 mg, Oral, Q6H PRN, 4 mg at 02/06/22 1147 **OR** ondansetron (ZOFRAN) injection 4 mg, 4 mg, Intravenous, Q6H PRN, Russella Dar, NP, 4 mg at 02/09/22 0939 ?  oxyCODONE (Oxy IR/ROXICODONE) immediate release tablet 5 mg, 5 mg, Oral, Q6H PRN, Hongalgi, Anand D, MD ?  pantoprazole (PROTONIX) EC tablet 40 mg, 40 mg, Oral, QHS, Hongalgi, Anand D, MD, 40 mg at 02/07/22 2112 ?  promethazine (PHENERGAN) 12.5 mg in sodium  chloride 0.9 % 50 mL IVPB, 12.5 mg, Intravenous, Q6H PRN, Hongalgi, Anand D, MD ?  sodium chloride flush (NS) 0.9 % injection 3 mL, 3 mL, Intravenous, Q12H, Russella Dar, NP, 3 mL at 02/09/22 7106 ? ? ?Exam: ?Current vital signs: ?BP (!) 151/97 (BP Location: Right Arm)   Pulse (!) 59   Temp 98.6 ?F (37 ?C) (Oral)   Resp 18   Ht 5\' 9"  (1.753 m)   Wt 91.6 kg   LMP 01/23/2022 (Approximate)   SpO2 93%   BMI 29.83 kg/m?  ?Vital signs in last 24 hours: ?Temp:  [97.7 ?F (36.5 ?C)-98.7 ?F (37.1 ?C)] 98.6 ?F (37 ?C) (05/07 1045) ?Pulse Rate:  [46-63] 59 (05/07 0517) ?Resp:  [16-18] 18 (05/07 1045) ?BP: (124-151)/(87-97) 151/97 (05/07 1045) ?SpO2:  [93 %-100 %] 93 % (05/07 1045) ? ?GENERAL: young female Awake, alert in NAD laying in bed  ?HEENT: - Normocephalic and atraumatic, dry mm ?LUNGS - Clear to auscultation bilaterally with no wheezes ?CV - S1S2 RRR, no m/r/g, equal pulses bilaterally. ?ABDOMEN - Soft, nontender, nondistended with normoactive BS ?Ext: warm, well perfused, intact peripheral pulses, no edema ? ?NEURO:  ?Mental Status: AA&Ox ?Language: speech is clear.  Naming, repetition, fluency, and comprehension intact. ?Cranial Nerves: PERRL 2 mm/brisk. EOMI, visual fields full, no facial asymmetry, facial sensation intact, hearing intact, tongue/uvula/soft palate midline, normal sternocleidomastoid and trapezius muscle strength. No evidence of tongue atrophy or fibrillations ?Motor: 5/5 in all 4 extremities  ?Tone: is normal and bulk is normal ?Sensation- Intact to light touch bilaterally ?Coordination: FTN intact bilaterally, no ataxia in BLE. ?Gait- deferred ? ? ? ?Imaging ?I have reviewed the images obtained: ? ?CT-head 5/6: ?1. No acute intracranial abnormality. ?2. Ethmoid air cell mucosal thickening ? ?CTA h/n 5/2: ?Normal  ? ?Assessment:  ?Wendy Vega is a 26 y.o. female with past medical history of anxiety and depression, seizure disorder and ADHD who presented to the [facility] ED on 5/2  for acute onset of severe neck pain, chills, body aches and a rash on upper chest and face.  Symptoms started upon waking up from a nap around 11 AM.  States she felt well prior to this.  She has severe pain in her neck with difficulty moving it from side to side, having chills and aches but no fever.  Noticed the rash to the right side of her neck and upper chest. LP was done:Total protein was 62 in context of blood, glucose was 70 and tube 1 of WBCs 5114 and tube 4 was 54. She had been given doses of empiric vancomycin, acyclovir and Rocephin.  ?Last pm she had the "worse headache of her life". She was given multiple doses of morphine with no relief and then was given Reglan and benadryl after which the headache subsided slightly. CTH with no acute abnormality. MOM at bedside  ?Patient with positional  headache that becomes worse when sitting up from laying position, prefers to be laying down since this is what helps her headaches, does endorse some photophobia. Just had some vomiting when changing position, pain is 4/10 currently ? ?Acute intractable headache that are positional and are likely related to a CSF leak from recent LP  ? ?Recommendations: ?- called IR- Dr. Odis LusterJ. Watts for epidural blood patch. No one in house today will need to to call IR Monday to see if someone is able to do this procedure.  ?- Called anesthesia, spoke with Management consultantolly Charge CRNA and they do not do procedure.  ?- Neurology to sign off. Please call with questions or concerns  ? ?Gevena Martenise Wolfe DNP, ACNPC-AG  ? ?I have seen the patient and reviewed the above note.  Her headache has a profoundly orthostatic nature, very much consistent with post lumbar puncture headache.  Given the severity, and the persistence she is at the point where I think I would recommend blood patch.  As noted above, attempts were made, but does not seem someone is able to do it.  Recommend contacting IR tomorrow. ? ?Neurology will be available as needed. ? ?Wendy SlotMcNeill  Toluwani Yadav, MD ?[facility] ?[phone] ? ?If 7pm- 7am, please page neurology on call as listed in AMION. ? ? ? ?

## 2022-02-09 NOTE — Progress Notes (Signed)
HOSPITAL MEDICINE OVERNIGHT EVENT NOTE   ? ?Nursing reports that patient continues to complain of severe headache. ? ?Daytime notes reviewed, suspicion by day attending and neurology that patient is suffering from post LP headache and will be evaluated for blood patch on 5/8. ? ?Patient states that the as needed intravenous Dilaudid is not lasting long enough.  We will give an additional one-time dose of 1 mg of intravenous Dilaudid now. ? ?Marinda Elk  MD ?[facility]  ? ? ? ? ? ? ? ? ? ? ?

## 2022-02-09 NOTE — Plan of Care (Signed)

## 2022-02-09 NOTE — Progress Notes (Signed)
Patient ID: Wendy Vega, female   DOB: 11-01-1995, 26 y.o.   MRN: [id number] ?[facility] ?Progress Note  ? ?Assessment/ Plan:   ?1. Acute kidney Injury: Suspected to be hemodynamically mediated +/- evolution to ATN from NSAID/contrast use.  Restarted on intravenous fluids after suboptimal fluid intake the day before (nausea).  Renal function slightly improved on labs this morning with reported robust urine output (that had not been charted).  We will continue to follow daily labs ?2.  Hyperkalemia: Secondary to excessive intake in the setting of acute kidney injury, successfully treated with Veltassa/bicarbonate drip. ?3.  Non-anion gap metabolic acidosis: Poorly explained etiology in the setting of acute kidney injury, improved with bicarbonate drip, continue maintenance IV fluids. ?4.  Aseptic meningitis: Earlier work-up negative and to complete doxycycline for total treatment duration of 7 days.  With recurrent post LP headache raising concern for CSF leak. ? ?Subjective:   ?She had "the worst headache of her life" yesterday and CT scan did not show any acute intracranial process.  Consideration being made for CSF leak and interrogation/treatment of this being undertaken.  ? ?Objective:   ?BP (!) 151/97 (BP Location: Right Arm)   Pulse (!) 59   Temp 98.6 ?F (37 ?C) (Oral)   Resp 18   Ht 5\' 9"  (1.753 m)   Wt 91.6 kg   LMP 01/23/2022 (Approximate)   SpO2 93%   BMI 29.83 kg/m?  ? ?Intake/Output Summary (Last 24 hours) at 02/09/2022 1053 ?Last data filed at 02/09/2022 0856 ?Gross per 24 hour  ?Intake 1975.29 ml  ?Output --  ?Net 1975.29 ml  ? ?Weight change:  ? ?Physical Exam: ?Gen: Comfortably resting in bed (photophobic), mother at bedside ?CVS: Pulse regular rhythm, bradycardia, S1 and S2 normal ?Resp: Clear to auscultation bilaterally, no rales/rhonchi ?Abd: Soft, flat, nontender, bowel sounds normal ?Ext: No lower extremity edema ? ?Imaging: ?CT HEAD WO CONTRAST (04/11/2022) ? ?Result Date:  02/08/2022 ?CLINICAL DATA:  Sudden onset of severe headache. EXAM: CT HEAD WITHOUT CONTRAST TECHNIQUE: Contiguous axial images were obtained from the base of the skull through the vertex without intravenous contrast. RADIATION DOSE REDUCTION: This exam was performed according to the departmental dose-optimization program which includes automated exposure control, adjustment of the mA and/or kV according to patient size and/or use of iterative reconstruction technique. COMPARISON:  Head CT 02/04/2022, 4 days ago FINDINGS: Brain: No intracranial hemorrhage, mass effect, or midline shift. No hydrocephalus. The basilar cisterns are patent. No evidence of territorial infarct or acute ischemia. No extra-axial or intracranial fluid collection. Vascular: No hyperdense vessel or unexpected calcification. Skull: No fracture or focal lesion. Sinuses/Orbits: Mucosal thickening throughout the ethmoid air cells. No sinus fluid levels. No mastoid effusion or opacification. Other: No subcutaneous scalp abnormality. IMPRESSION: 1. No acute intracranial abnormality. 2. Ethmoid air cell mucosal thickening. Electronically Signed   By: 04/06/2022 M.D.   On: 02/08/2022 22:28   ? ?Labs: ?BMET ?Recent Labs  ?Lab 02/04/22 ?1811 02/05/22 ?04/07/22 02/06/22 ?0243 02/07/22 ?04/09/22 02/08/22 ?04/10/22 02/09/22 ?04/11/22  ?NA 138 136 139 137 139 139  ?K 3.8 4.1 3.4* 3.7 5.8* 4.0  ?CL 106 107 109 112* 115* 112*  ?CO2 21* 19* 20* 18* 15* 22  ?GLUCOSE 112* 231* 99 106* 93 109*  ?BUN 12 7 9 10 13 10   ?CREATININE 0.66 0.56 1.20* 1.82* 1.85* 1.69*  ?CALCIUM 9.2 8.5* 8.1* 8.0* 8.8* 8.2*  ?PHOS  --   --   --   --   --  3.4  ? ?CBC ?Recent Labs  ?Lab 02/04/22 ?1811 02/06/22 ?0243 02/07/22 ?6226 02/09/22 ?3335  ?WBC 17.1* 12.4* 11.2* 10.5  ?NEUTROABS 14.9*  --   --   --   ?HGB 14.4 12.1 11.4* 11.6*  ?HCT 43.7 36.3 33.7* 33.1*  ?MCV 89.0 90.5 88.9 87.8  ?PLT 247 170 156 180  ? ?Medications:   ? ? doxycycline  100 mg Oral Q12H  ? enoxaparin (LOVENOX) injection  40 mg  Subcutaneous Q24H  ? escitalopram  20 mg Oral Daily  ? levETIRAcetam  500 mg Oral BID  ? lidocaine  1 patch Transdermal Q24H  ? lisdexamfetamine  20 mg Oral Daily  ? pantoprazole  40 mg Oral QHS  ? sodium chloride flush  3 mL Intravenous Q12H  ? ?Zetta Bills, MD ?02/09/2022, 10:53 AM  ? ?

## 2022-02-09 NOTE — Progress Notes (Signed)
?PROGRESS NOTE ?  ?Wendy Vega  [id number]    DOB: 09-16-1996    DOA: 02/04/2022 ? ?PCP: Avon Gully, NP  ? ?I have briefly reviewed patients previous medical records in [facility]. ? ?Chief Complaint  ?Patient presents with  ? Torticollis  ? ? ?Hospital Course:  ?26 year old female with medical history significant for anxiety, depression, seizure disorder on Keppra and ADHD, presented with complaints of generalized pains, acute onset of petechial rash of face, upper chest and neck pain and stiffness.  S/p lumbar puncture.  Admitted for suspected asymptomatic meningitis.  Improving from the standpoint.  Course complicated by AKI and hyperkalemia.  Nephrology consulted.  AKI improving.  Course complicated by post lumbar puncture headache, neurology consulted. ? ?Assessment and Plan: ?* Meningitis ?Presentation concern for meningitis. ?Normal CTA of head and neck. ?CSF 42 K RBCs, 114 WBCs, predominantly neutrophilic, total protein 62. ?CSF culture and blood cultures x2: Negative to date. ?Started empirically on IV acyclovir, ceftriaxone and vancomycin. ?Follow serology for Ehrlichia (negative), HSV 1/2 PCR (negative), RMSF (negative).  Lyme's antibody negative.  Serum pregnancy test negative.  HIV and hepatitis panel negative. ?ID consulted and suspecting possible aseptic meningitis with concerns of RMSF given petechial rash.   ?Discontinued vancomycin and ceftriaxone as CSF studies probably suggest traumatic tap.   ?Skin rash continues to improve. ?Clinically improving. ?HSV 1 and 2 PCR and RMSF serology are negative ?As per ID signoff 5/5: Acyclovir discontinued, recommend completing 7 days of doxycycline.  All isolation discontinued. ?Ongoing post LP headache, worst overnight 5/6, neurology consulted, agree that her presentation consistent with post lumbar puncture headache, they recommend a blood patch but unable to find anyone to do it on the weekend, plan to contact IR 5/8. ?Adjusted pain meds,  reports morphine does not work, changed to Oxy IR and Dilaudid. ? ?AKI (acute kidney injury) ([place]) ?Likely multifactorial: Dehydration from nausea and vomiting, ATN from contrast for CTA chest on admission and IV Toradol.  Vancomycin level normal. ?Discontinued IV Toradol.  Avoid nephrotoxic's. ?Creatinine progressively increasing, up to 1.8 but plateaued and at 1.8 today.  Nonoliguric. ?FeNA 1.7. ?Nephrology following.  Due to metabolic acidosis and hyperkalemia, initiating IV bicarb drip especially given inconsistent and poor oral intake. ?Creatinine slightly better.  Continuing maintenance IV fluids for AKI, post LP headache with concern for CSF leak and due to inconsistent p.o. intake. ? ?Hyperkalemia ?May be due to AKI. K 5.8 on 5/6 ?Phlebotomy team had quite difficulty and patient had to be stuck multiple times for lab draws but no mention of hemolysis on results. ?Resolved after dose of Veltassa and bicarbonate drip. ? ?Metabolic acidosis, normal anion gap (NAG) ?Unclear etiology. HCO3: 15 on 5/6 ??  Secondary to AKI and GI losses from vomiting. ?Resolved after bicarb drip. ? ?Elevated CK-resolved as of 02/08/2022 ?CK 551 on admission.  Unclear etiology. ?Improved, down to 261 on 5/4. ? ?Rash ?Continues to steadily improve. ? ?Dehydration, mild-resolved as of 02/08/2022 ?Resolved with IV fluids. ? ?Seizure disorder ([place]) ?Continue home dose of Keppra.  Seizure precautions. ?Keppra level low, however no change in dose. ? ?Anxiety and depression ?Continue home Lexapro and Vyvanse. ? ?Acute hyperglycemia-resolved as of 02/08/2022 ?Suspected due to a dose of Decadron in ED. ?FBS on a.m. labs 99. ? ?Hypokalemia-resolved as of 02/08/2022 ?Replaced. ? ? ?Body mass index is 29.83 kg/m?. ? ? ?DVT prophylaxis: enoxaparin (LOVENOX) injection 40 mg Start: 02/05/22 0815   ?  Code Status: Full Code:  ?Family Communication: Mother  at bedside. ?Disposition:  ?Status is: Inpatient ?Remains inpatient appropriate because: IV fluids ?   ? ?Consultants:   ?Infectious disease ?Nephrology ? ?Procedures:   ?LP ? ?Antimicrobials:   ?As above ? ? ?Subjective:  ?Overnight events noted, worse headache, no significant relief with IV morphine, Reglan and Benadryl cocktail.  No history of migraine.  Headache somewhat better today but still significant on sitting up in bed.  Had a couple episodes of vomiting yesterday.  Persistent nausea.  Ongoing poor oral intake. ? ?Objective:  ? ?Vitals:  ? 02/08/22 1700 02/08/22 2100 02/09/22 0517 02/09/22 1045  ?BP: (!) 150/93 (!) 148/92 124/87 (!) 151/97  ?Pulse: (!) 46 63 (!) 59   ?Resp:  17 16 18   ?Temp: 97.7 ?F (36.5 ?C) 97.7 ?F (36.5 ?C) 98.7 ?F (37.1 ?C) 98.6 ?F (37 ?C)  ?TempSrc: Oral Oral Oral Oral  ?SpO2: 98% 100% 96% 93%  ?Weight:      ?Height:      ? ? ?General exam: Young female, moderately built and overweight lying comfortably propped up in bed without distress.  Does not look septic or toxic.  Oral mucosa borderline hydration. ?Neck: Mildly restricted flexion and lateral movements. ?Respiratory system: Clear to auscultation. ?Cardiovascular system: S1 & S2 heard, RRR. No JVD, murmurs, rubs, gallops or clicks. No pedal edema.  Not on telemetry. ?Gastrointestinal system: Abdomen is nondistended, soft and nontender. No organomegaly or masses felt. Normal bowel sounds heard. ?Central nervous system: Alert and oriented.  No focal deficits. ?Extremities: Symmetric 5 x 5 power. ?Skin: Facial, right side of base of neck and left upper anterior chest rash have resolved.  LP site without acute findings.  No significant tenderness on spinal area palpation ?Psychiatry: Judgement and insight appear normal. Mood & affect appropriate.  ? ?Data Reviewed:   ?I have personally reviewed following labs and imaging studies ? ?CBC: ?Recent Labs  ?Lab 02/04/22 ?1811 02/06/22 ?0243 02/07/22 ?[id number] 02/09/22 ?[id number]  ?WBC 17.1* 12.4* 11.2* 10.5  ?NEUTROABS 14.9*  --   --   --   ?HGB 14.4 12.1 11.4* 11.6*  ?HCT 43.7 36.3 33.7* 33.1*   ?MCV 89.0 90.5 88.9 87.8  ?PLT 247 170 156 180  ? ?Basic Metabolic Panel: ?Recent Labs  ?Lab 02/05/22 ?[id number] 02/06/22 ?0243 02/07/22 ?[id number] 02/08/22 ?[id number] 02/09/22 ?[id number]  ?NA 136 139 137 139 139  ?K 4.1 3.4* 3.7 5.8* 4.0  ?CL 107 109 112* 115* 112*  ?CO2 19* 20* 18* 15* 22  ?GLUCOSE 231* 99 106* 93 109*  ?BUN 7 9 10 13 10   ?CREATININE 0.56 1.20* 1.82* 1.85* 1.69*  ?CALCIUM 8.5* 8.1* 8.0* 8.8* 8.2*  ?PHOS  --   --   --   --  3.4  ? ?Liver Function Tests: ?Recent Labs  ?Lab 02/04/22 ?1811 02/09/22 ?[id number]  ?AST 20  --   ?ALT 15  --   ?ALKPHOS 62  --   ?BILITOT 0.3  --   ?PROT 7.5  --   ?ALBUMIN 4.6 2.8*  ? ?CBG: ?No results for input(s): GLUCAP in the last 168 hours. ?Microbiology Studies:  ? ?Recent Results (from the past 240 hour(s))  ?Resp Panel by RT-PCR (Flu A&B, Covid) Nasopharyngeal Swab     Status: None  ? Collection Time: 02/04/22  6:11 PM  ? Specimen: Nasopharyngeal Swab; Nasopharyngeal(NP) swabs in vial transport medium  ?Result Value Ref Range Status  ? SARS Coronavirus 2 by RT PCR NEGATIVE NEGATIVE Final  ?  Comment: (NOTE) ?SARS-CoV-2 target nucleic acids are NOT  DETECTED. ? ?The SARS-CoV-2 RNA is generally detectable in upper respiratory ?specimens during the acute phase of infection. The lowest ?concentration of SARS-CoV-2 viral copies this assay can detect is ?138 copies/mL. A negative result does not preclude SARS-Cov-2 ?infection and should not be used as the sole basis for treatment or ?other patient management decisions. A negative result may occur with  ?improper specimen collection/handling, submission of specimen other ?than nasopharyngeal swab, presence of viral mutation(s) within the ?areas targeted by this assay, and inadequate number of viral ?copies(<138 copies/mL). A negative result must be combined with ?clinical observations, patient history, and epidemiological ?information. The expected result is Negative. ? ?Fact Sheet for Patients:  ?EntrepreneurPulse.com.au ? ?Fact  Sheet for Healthcare Providers:  ?IncredibleEmployment.be ? ?This test is no t yet approved or cleared by the [place] FDA and  ?has been authorized for detection and/or diagnosis of S

## 2022-02-10 ENCOUNTER — Inpatient Hospital Stay (HOSPITAL_COMMUNITY): Payer: Commercial Managed Care - PPO

## 2022-02-10 DIAGNOSIS — G039 Meningitis, unspecified: Secondary | ICD-10-CM | POA: Diagnosis not present

## 2022-02-10 DIAGNOSIS — E875 Hyperkalemia: Secondary | ICD-10-CM | POA: Diagnosis not present

## 2022-02-10 DIAGNOSIS — G971 Other reaction to spinal and lumbar puncture: Secondary | ICD-10-CM | POA: Diagnosis not present

## 2022-02-10 DIAGNOSIS — N179 Acute kidney failure, unspecified: Secondary | ICD-10-CM | POA: Diagnosis not present

## 2022-02-10 HISTORY — PX: IR FLUORO GUIDED NEEDLE PLC ASPIRATION/INJECTION LOC: IMG2395

## 2022-02-10 LAB — BASIC METABOLIC PANEL
Anion gap: 8 (ref 5–15)
BUN: 9 mg/dL (ref 6–20)
CO2: 17 mmol/L — ABNORMAL LOW (ref 22–32)
Calcium: 8.2 mg/dL — ABNORMAL LOW (ref 8.9–10.3)
Chloride: 116 mmol/L — ABNORMAL HIGH (ref 98–111)
Creatinine, Ser: 1.49 mg/dL — ABNORMAL HIGH (ref 0.44–1.00)
GFR, Estimated: 50 mL/min — ABNORMAL LOW (ref >60)
Glucose, Bld: 88 mg/dL (ref 70–99)
Potassium: 3.9 mmol/L (ref 3.5–5.1)
Sodium: 141 mmol/L (ref 135–145)

## 2022-02-10 LAB — RENAL FUNCTION PANEL
Albumin: 2.7 g/dL — ABNORMAL LOW (ref 3.5–5.0)
Anion gap: 9 (ref 5–15)
BUN: 9 mg/dL (ref 6–20)
CO2: 17 mmol/L — ABNORMAL LOW (ref 22–32)
Calcium: 8.2 mg/dL — ABNORMAL LOW (ref 8.9–10.3)
Chloride: 115 mmol/L — ABNORMAL HIGH (ref 98–111)
Creatinine, Ser: 1.63 mg/dL — ABNORMAL HIGH (ref 0.44–1.00)
GFR, Estimated: 45 mL/min — ABNORMAL LOW (ref >60)
Glucose, Bld: 116 mg/dL — ABNORMAL HIGH (ref 70–99)
Phosphorus: 4.5 mg/dL (ref 2.5–4.6)
Potassium: 5.9 mmol/L — ABNORMAL HIGH (ref 3.5–5.1)
Sodium: 141 mmol/L (ref 135–145)

## 2022-02-10 LAB — EHRLICHIA ANTIBODY PANEL
E chaffeensis (HGE) Ab, IgG: NEGATIVE
E chaffeensis (HGE) Ab, IgM: NEGATIVE
E. Chaffeensis (HME) IgM Titer: NEGATIVE
E.Chaffeensis (HME) IgG: NEGATIVE

## 2022-02-10 MED ORDER — STERILE WATER FOR INJECTION IJ SOLN
INTRAMUSCULAR | Status: AC
  Filled 2022-02-10: qty 20

## 2022-02-10 MED ORDER — LIDOCAINE HCL 1 % IJ SOLN
INTRAMUSCULAR | Status: AC
  Administered 2022-02-10: 10 mL
  Filled 2022-02-10: qty 20

## 2022-02-10 MED ORDER — SODIUM CHLORIDE 0.9 % IV SOLN: INTRAVENOUS | Status: DC

## 2022-02-10 MED ORDER — HYDROMORPHONE HCL 1 MG/ML IJ SOLN
1.0000 mg | INTRAMUSCULAR | Status: DC | PRN
  Administered 2022-02-10 – 2022-02-11 (×6): 1 mg via INTRAVENOUS
  Filled 2022-02-10 (×7): qty 1

## 2022-02-10 MED ORDER — SODIUM CHLORIDE 0.9 % IV SOLN
25.0000 mg | Freq: Four times a day (QID) | INTRAVENOUS | Status: DC | PRN
  Filled 2022-02-10: qty 1

## 2022-02-10 MED ORDER — NALOXONE HCL 0.4 MG/ML IJ SOLN: 0.4000 mg | INTRAMUSCULAR | Status: DC | PRN

## 2022-02-10 MED ORDER — SODIUM ZIRCONIUM CYCLOSILICATE 10 G PO PACK
10.0000 g | PACK | Freq: Two times a day (BID) | ORAL | Status: AC
  Administered 2022-02-10 (×2): 10 g via ORAL
  Filled 2022-02-10 (×2): qty 1

## 2022-02-10 MED ORDER — OXYCODONE HCL 5 MG PO TABS
10.0000 mg | ORAL_TABLET | ORAL | Status: DC | PRN
  Administered 2022-02-10 (×3): 10 mg via ORAL
  Filled 2022-02-10 (×3): qty 2

## 2022-02-10 NOTE — Progress Notes (Addendum)
Pt not due for Dilaudid IV PRN but has now complaining severe pain and been crying. Tylenol PO was given. Page DR Leafy Half about her pain. ?As per Pt, describes pain as sharp pain from top of the head towards the back and then neck. IV pain meds just not relieving her pain long enough till its due. ? ?2330 received order from  DR. Pt got 2mg  IV dilaudid in total. Will monitor. ? ?0640 02/08/2022- pt slept good, pain scale at 5 at this moment. ?

## 2022-02-10 NOTE — Progress Notes (Signed)
?PROGRESS NOTE ?  ?Wendy Vega  UTM:[id number]    DOB: Apr 24, 1996    DOA: 02/04/2022 ? ?PCP: Lynden Ang, NP  ? ?I have briefly reviewed patients previous medical records in [facility]. ? ?Chief Complaint  ?Patient presents with  ? Torticollis  ? ? ?Hospital Course:  ?26 year old female with medical history significant for anxiety, depression, seizure disorder on Keppra and ADHD, presented with complaints of generalized pains, acute onset of petechial rash of face, upper chest and neck pain and stiffness.  S/p lumbar puncture.  Admitted for suspected asymptomatic meningitis.  Improving from the standpoint.  Course complicated by AKI and hyperkalemia.  Nephrology consulted.  AKI improving.  Course complicated by post lumbar puncture headache, neurology consulted recommended blood patch for CSF leak.  Unable to find anyone to perform this on the weekend.  Consulted IR, discussed with Dr Jetty Peeks 5/9 who stated that he will try to find somebody who does it within the group. ? ?Assessment and Plan: ?* Meningitis ?Presentation concern for meningitis. ?Normal CTA of head and neck. ?CSF 42 K RBCs, 114 WBCs, predominantly neutrophilic, total protein 62. ?CSF culture and blood cultures x2: Negative to date. ?Started empirically on IV acyclovir, ceftriaxone and vancomycin. ?Follow serology for Ehrlichia (negative), HSV 1/2 PCR (negative), RMSF (negative).  Lyme's antibody negative.  Serum pregnancy test negative.  HIV and hepatitis panel negative. ?ID consulted and suspecting possible aseptic meningitis with concerns of RMSF given petechial rash.   ?Discontinued vancomycin and ceftriaxone as CSF studies probably suggest traumatic tap.   ?Skin rash continues to improve. ?Clinically improving. ?HSV 1 and 2 PCR and RMSF serology are negative ?As per ID signoff 5/5: Acyclovir discontinued, recommend completing 7 days of doxycycline.  All isolation discontinued. ?Ongoing post LP headache, at its worst over the last  2 nights, neurology consulted, agree that her presentation is consistent with post lumbar puncture headache, they recommend a blood patch but unable to find anyone to do it on the weekend.  Consulted and discussed with IR 5/9 and awaiting for this to be done. ?Due to uncontrolled pain, titrated up Oxy IR and Dilaudid IV as needed. ? ?AKI (acute kidney injury) (HCC) ?Likely multifactorial: Dehydration from nausea and vomiting, ATN from contrast for CTA chest on admission and IV Toradol.  Vancomycin level normal. ?Discontinued IV Toradol.  Avoid nephrotoxic's. ?Creatinine progressively increasing, up to 1.8 but plateaued and at 1.8 today.  Nonoliguric. ?FeNA 1.7. ?Nephrology following.  Due to metabolic acidosis and hyperkalemia, treated briefly with IV bicarb drip followed by maintenance IVF. ?Creatinine improved slightly but plateaued in the 1.6 range over the last 2 days.  Remains on IVF. ? ?Hyperkalemia ?May be due to AKI. K 5.8 on 5/6 ?This had resolved after a dose of Veltassa and bicarbonate drip but has recurred again with potassium of 5.9. ?Lokelma 10 g twice daily.  Recheck BMP later this afternoon.  Await nephrology follow-up. ? ?Metabolic acidosis, normal anion gap (NAG) ?Unclear etiology. HCO3: 15 on 5/6 ??  Secondary to AKI and GI losses from vomiting. ?Had resolved after bicarb drip but now bicarb down to 17, normal anion gap.?  Restart bicarb drip.  Defer to nephrology. ? ?Elevated CK-resolved as of 02/08/2022 ?CK 551 on admission.  Unclear etiology. ?Improved, down to 261 on 5/4. ? ?Rash ?Resolved. ? ?Dehydration, mild-resolved as of 02/08/2022 ?Resolved with IV fluids. ? ?Seizure disorder (HCC) ?Continue home dose of Keppra.  Seizure precautions. ?Keppra level low, however no change in dose. ? ?  Anxiety and depression ?Continue home Lexapro and Vyvanse. ? ?Acute hyperglycemia-resolved as of 02/08/2022 ?Suspected due to a dose of Decadron in ED. ?FBS on a.m. labs 99. ? ?Hypokalemia-resolved as of  02/08/2022 ?Replaced. ? ? ?Body mass index is 29.83 kg/m?. ? ? ?DVT prophylaxis: enoxaparin (LOVENOX) injection 40 mg Start: 02/05/22 0815   ?  Code Status: Full Code:  ?Family Communication: Mother at bedside. ?Disposition:  ?Status is: Inpatient ?Remains inpatient appropriate because: IV fluids ?  ? ?Consultants:   ?Infectious disease ?Nephrology ?Interventional radiology ? ?Procedures:   ?LP ? ?Antimicrobials:   ?As above ? ? ?Subjective:  ?Overnight events noted.  Ongoing issues with severe headache, nausea and poor oral intake, pain and nausea not controlled on prior regimen.  Patient and mother frustrated due to inability to get timely meds for relief.  Symptoms seem to be worse at night. ? ?Objective:  ? ?Vitals:  ? 02/09/22 2321 02/10/22 0640 02/10/22 0712 02/10/22 1053  ?BP: (!) 171/100 (!) 149/104 (!) 165/104 (!) 147/102  ?Pulse: 81 (!) 49 (!) 53 61  ?Resp:  20 19 20   ?Temp: 97.8 ?F (36.6 ?C)  98 ?F (36.7 ?C) 97.7 ?F (36.5 ?C)  ?TempSrc: Oral   Oral  ?SpO2: 100% 99% 96% 96%  ?Weight:      ?Height:      ? ? ?General exam: Young female, moderately built and overweight currently lying comfortably supine in bed.  In fact both patient and mother were asleep and bed in reclining chair respectively. ?Neck: Mildly restricted flexion and lateral movements. ?Respiratory system: Clear to auscultation.  No increased work of breathing. ?Cardiovascular system: S1 & S2 heard, RRR. No JVD, murmurs, rubs, gallops or clicks. No pedal edema.  Not on telemetry. ?Gastrointestinal system: Abdomen is nondistended, soft and nontender. No organomegaly or masses felt. Normal bowel sounds heard. ?Central nervous system: Alert and oriented.  No focal deficits. ?Extremities: Symmetric 5 x 5 power. ?Skin: Rash of face, right lower neck and left upper chest have resolved. ?Psychiatry: Judgement and insight appear normal. Mood & affect appropriate.  ? ?Data Reviewed:   ?I have personally reviewed following labs and imaging  studies ? ?CBC: ?Recent Labs  ?Lab 02/04/22 ?1811 02/06/22 ?0243 02/07/22 ?04/09/22 02/09/22 ?04/11/22  ?WBC 17.1* 12.4* 11.2* 10.5  ?NEUTROABS 14.9*  --   --   --   ?HGB 14.4 12.1 11.4* 11.6*  ?HCT 43.7 36.3 33.7* 33.1*  ?MCV 89.0 90.5 88.9 87.8  ?PLT 247 170 156 180  ? ?Basic Metabolic Panel: ?Recent Labs  ?Lab 02/06/22 ?0243 02/07/22 ?04/09/22 02/08/22 ?04/10/22 02/09/22 ?04/11/22 02/10/22 ?0405  ?NA 139 137 139 139 141  ?K 3.4* 3.7 5.8* 4.0 5.9*  ?CL 109 112* 115* 112* 115*  ?CO2 20* 18* 15* 22 17*  ?GLUCOSE 99 106* 93 109* 116*  ?BUN 9 10 13 10 9   ?CREATININE 1.20* 1.82* 1.85* 1.69* 1.63*  ?CALCIUM 8.1* 8.0* 8.8* 8.2* 8.2*  ?PHOS  --   --   --  3.4 4.5  ? ?Liver Function Tests: ?Recent Labs  ?Lab 02/04/22 ?1811 02/09/22 ?04/06/22 02/10/22 ?0405  ?AST 20  --   --   ?ALT 15  --   --   ?ALKPHOS 62  --   --   ?BILITOT 0.3  --   --   ?PROT 7.5  --   --   ?ALBUMIN 4.6 2.8* 2.7*  ? ?CBG: ?No results for input(s): GLUCAP in the last 168 hours. ?Microbiology Studies:  ? ?Recent Results (from  the past 240 hour(s))  ?Resp Panel by RT-PCR (Flu A&B, Covid) Nasopharyngeal Swab     Status: None  ? Collection Time: 02/04/22  6:11 PM  ? Specimen: Nasopharyngeal Swab; Nasopharyngeal(NP) swabs in vial transport medium  ?Result Value Ref Range Status  ? SARS Coronavirus 2 by RT PCR NEGATIVE NEGATIVE Final  ?  Comment: (NOTE) ?SARS-CoV-2 target nucleic acids are NOT DETECTED. ? ?The SARS-CoV-2 RNA is generally detectable in upper respiratory ?specimens during the acute phase of infection. The lowest ?concentration of SARS-CoV-2 viral copies this assay can detect is ?138 copies/mL. A negative result does not preclude SARS-Cov-2 ?infection and should not be used as the sole basis for treatment or ?other patient management decisions. A negative result may occur with  ?improper specimen collection/handling, submission of specimen other ?than nasopharyngeal swab, presence of viral mutation(s) within the ?areas targeted by this assay, and inadequate number of  viral ?copies(<138 copies/mL). A negative result must be combined with ?clinical observations, patient history, and epidemiological ?information. The expected result is Negative. ? ?Fact Sheet for Patients:  ?https://www.fda.go

## 2022-02-10 NOTE — Progress Notes (Signed)
Patient ID: Wendy Vega, female   DOB: 1996/01/23, 26 y.o.   MRN: [id number] ? [facility] ?Progress Note  ? ?Assessment/ Plan:   ?1. Acute kidney Injury: felt hemodynamically mediated +/- evolution to ATN from NSAID/contrast use.  Restarted on intravenous fluids after suboptimal fluid intake the day before (nausea). ?- slowly improving however with persistent hyperkalemia  ?- transition to normal saline at 100 ml/hr ? ?2.  Hyperkalemia: Secondary to excessive intake in the setting of acute kidney injury.  On renal diet.  Lokelma x 2 doses is ordered for today.  Noted BMP in for 4 pm ? ?3.  Non-anion gap metabolic acidosis: Poorly explained etiology in the setting of acute kidney injury, improved s/p bicarbonate drip, continue maintenance IV fluids ? ?4.  Aseptic meningitis: Earlier work-up negative and to complete doxycycline for total treatment duration of 7 days.  With recurrent post LP headache raising concern for CSF leak. ?- per primary team  ? ?Disposition - per primary team ? ?Subjective:   ? Pt with 550 mL uop over 5/7 as well as one unmeasured void.  They state that she is to have a blood patch today.  Has had a headache. Not keeping much down - tried mac and cheese yesterday and threw it up.  ? ?Review of systems: ?Denies shortness of breath or chest pain  ?Just urinated in restroom ?Nausea with PRN use ?  ? ?Objective:   ?BP (!) 147/102 (BP Location: Left Arm)   Pulse 61   Temp 97.7 ?F (36.5 ?C) (Oral)   Resp 20   Ht _0  (1.753 m)   Wt 91.6 kg   LMP 01/23/2022 (Approximate)   SpO2 96%   BMI 29.83 kg/m?  ? ?Intake/Output Summary (Last 24 hours) at 02/10/2022 1306 ?Last data filed at 02/10/2022 0401 ?Gross per 24 hour  ?Intake 3071.42 ml  ?Output 200 ml  ?Net 2871.42 ml  ? ?Weight change:  ? ?Physical Exam:  ?General adult female in bed in no acute distress. All lights off ?HEENT normocephalic atraumatic extraocular movements intact sclera anicteric ?Neck supple trachea midline ?Lungs  clear to auscultation bilaterally normal work of breathing at rest  ?Heart S1S2 no rub ?Abdomen soft nontender nondistended ?Extremities no edema  ?Neuro alert and oriented x 3 provides hx and follows commands ?Psych normal mood and affect ? ?Imaging: ?CT HEAD WO CONTRAST (5MM) ? ?Result Date: 02/08/2022 ?CLINICAL DATA:  Sudden onset of severe headache. EXAM: CT HEAD WITHOUT CONTRAST TECHNIQUE: Contiguous axial images were obtained from the base of the skull through the vertex without intravenous contrast. RADIATION DOSE REDUCTION: This exam was performed according to the departmental dose-optimization program which includes automated exposure control, adjustment of the mA and/or kV according to patient size and/or use of iterative reconstruction technique. COMPARISON:  Head CT 02/04/2022, 4 days ago FINDINGS: Brain: No intracranial hemorrhage, mass effect, or midline shift. No hydrocephalus. The basilar cisterns are patent. No evidence of territorial infarct or acute ischemia. No extra-axial or intracranial fluid collection. Vascular: No hyperdense vessel or unexpected calcification. Skull: No fracture or focal lesion. Sinuses/Orbits: Mucosal thickening throughout the ethmoid air cells. No sinus fluid levels. No mastoid effusion or opacification. Other: No subcutaneous scalp abnormality. IMPRESSION: 1. No acute intracranial abnormality. 2. Ethmoid air cell mucosal thickening. Electronically Signed   By: Keith Rake M.D.   On: 02/08/2022 22:28   ? ?Labs: ?BMET ?Recent Labs  ?Lab 02/04/22 ?1811 02/05/22 ?4174 02/06/22 ?0243 02/07/22 ?0814 02/08/22 ?4818 02/09/22 ?5631 02/10/22 ?  0405  ?NA 138 136 139 137 139 139 141  ?K 3.8 4.1 3.4* 3.7 5.8* 4.0 5.9*  ?CL 106 107 109 112* 115* 112* 115*  ?CO2 21* 19* 20* 18* 15* 22 17*  ?GLUCOSE 112* 231* 99 106* 93 109* 116*  ?BUN _0 ?CREATININE 0.66 0.56 1.20* 1.82* 1.85* 1.69* 1.63*  ?CALCIUM 9.2 8.5* 8.1* 8.0* 8.8* 8.2* 8.2*  ?PHOS  --   --   --   --   --  3.4  4.5  ? ?CBC ?Recent Labs  ?Lab 02/04/22 ?1811 02/06/22 ?0243 02/07/22 ?5686 02/09/22 ?1683  ?WBC 17.1* 12.4* 11.2* 10.5  ?NEUTROABS 14.9*  --   --   --   ?HGB 14.4 12.1 11.4* 11.6*  ?HCT 43.7 36.3 33.7* 33.1*  ?MCV 89.0 90.5 88.9 87.8  ?PLT 247 170 156 180  ? ?Medications:   ? ? doxycycline  100 mg Oral Q12H  ? enoxaparin (LOVENOX) injection  40 mg Subcutaneous Q24H  ? escitalopram  20 mg Oral Daily  ? levETIRAcetam  500 mg Oral BID  ? lidocaine  1 patch Transdermal Q24H  ? lisdexamfetamine  20 mg Oral Daily  ? pantoprazole  40 mg Oral QHS  ? sodium chloride flush  3 mL Intravenous Q12H  ? sodium zirconium cyclosilicate  10 g Oral BID WC  ? ?Claudia Desanctis, MD ?02/10/2022, 1:24 PM ? ? ?

## 2022-02-11 DIAGNOSIS — G971 Other reaction to spinal and lumbar puncture: Secondary | ICD-10-CM

## 2022-02-11 HISTORY — DX: Other reaction to spinal and lumbar puncture: G97.1

## 2022-02-11 LAB — MISC LABCORP TEST (SEND OUT): Labcorp test code: 9985

## 2022-02-11 LAB — BASIC METABOLIC PANEL
Anion gap: 10 (ref 5–15)
BUN: 9 mg/dL (ref 6–20)
CO2: 16 mmol/L — ABNORMAL LOW (ref 22–32)
Calcium: 8.6 mg/dL — ABNORMAL LOW (ref 8.9–10.3)
Chloride: 113 mmol/L — ABNORMAL HIGH (ref 98–111)
Creatinine, Ser: 1.5 mg/dL — ABNORMAL HIGH (ref 0.44–1.00)
GFR, Estimated: 49 mL/min — ABNORMAL LOW (ref >60)
Glucose, Bld: 84 mg/dL (ref 70–99)
Potassium: 4.9 mmol/L (ref 3.5–5.1)
Sodium: 139 mmol/L (ref 135–145)

## 2022-02-11 MED ORDER — SODIUM BICARBONATE 650 MG PO TABS
650.0000 mg | ORAL_TABLET | Freq: Two times a day (BID) | ORAL | Status: DC
  Administered 2022-02-11 – 2022-02-12 (×3): 650 mg via ORAL
  Filled 2022-02-11 (×3): qty 1

## 2022-02-11 NOTE — Progress Notes (Signed)
Patient ID: Wendy Vega, female   DOB: 1996-10-01, 26 y.o.   MRN: [id number] ?[facility] ?Progress Note  ? ?Assessment/ Plan:   ?1. Acute kidney Injury: felt hemodynamically mediated +/- evolution to ATN from NSAID/contrast use.  Restarted on intravenous fluids after suboptimal fluid intake the day before (nausea). ?- slowly improving with supportive care ?- normal saline at 100 ml/hr while here   ? ?2.  Hyperkalemia: Secondary to excessive intake in the setting of acute kidney injury.  On renal diet - would continue same on discharge.  We discussed this today.  ? ?3.  Non-anion gap metabolic acidosis: setting of acute kidney injury.  Start PO bicarb 650 mg BID.  On normal saline which may be exacerbating.  Can stop on outpatient follow-up if bicarbonate normalized ? ?4.  Aseptic meningitis: Earlier work-up negative and to complete doxycycline for total treatment duration of 7 days.  With recurrent post LP headache raising concern for CSF leak. ?- per primary team  ? ?Disposition - per primary team.  Nephrology will sign off.  I will set up outpatient follow-up with me in two weeks.  She states that she does not have a PCP.  At that time anticipate transition to PRN Follow-up and stopping the bicarb as well as hopefully relaxing her diet.  ? ?Subjective:   ? Pt with 2.4 liters UOP over 5/8.  She underwent lumbar epidural blood patch in the interim.  She has been on normal saline at 100 ml/hr.  Feels much better since the patch.  Drinking soup.  Spoke with her mother at bedside.  She has an ob/gyn and a neurologist but not a PCP.  Her mother is a patient at [facility] and they are going to try there. ? ?Review of systems:  ?Denies shortness of breath or chest pain  ?Nausea is better - no vomiting in the past day ?  ? ?Objective:   ?BP (!) 158/105 (BP Location: Left Arm)   Pulse 62   Temp 98.3 ?F (36.8 ?C) (Oral)   Resp 20   Ht 5\' 9"  (1.753 m)   Wt 91.6 kg   LMP 01/23/2022 (Approximate)   SpO2  96%   BMI 29.83 kg/m?  ? ?Intake/Output Summary (Last 24 hours) at 02/11/2022 0948 ?Last data filed at 02/11/2022 04/13/2022 ?Gross per 24 hour  ?Intake 1688.33 ml  ?Output 3250 ml  ?Net -1561.67 ml  ? ?Weight change:  ? ?Physical Exam:    ?General adult female in bed in no acute distress. All lights off ?HEENT normocephalic atraumatic extraocular movements intact sclera anicteric ?Neck supple trachea midline ?Lungs clear to auscultation bilaterally normal work of breathing at rest  ?Heart S1S2 no rub ?Abdomen soft nontender nondistended ?Extremities no edema  ?Neuro alert and oriented x 3 provides hx and follows commands ?Psych normal mood and affect ? ?Imaging: ?IR Fluoro Guide Ndl Plmt / BX ? ?Result Date: 02/10/2022 ?CLINICAL DATA:  Persistent spinal headache 6 days post bedside lumbar puncture EXAM: LUMBAR EPIDUROGRAM AND BLOOD PATCH FLUOROSCOPY: Radiation Exposure Index (as provided by the fluoroscopic device): 9 MGy air Kerma COMPARISON:  None Available. TECHNIQUE: The skin entry site from previous lumbar puncture was localized corresponding to the space between the L3 and L4 spinous processes. A relatively diminutive L5 spinous process is noted. An appropriate skin entry site was determined. Operator donned sterile gloves and mask. Skin site was marked, prepped with Betadine, and draped in usual sterile fashion, and infiltrated locally with 1% lidocaine. The  20 gauge Crawford needle was advanced into the lumbar posterior epidural space near the midline using a left interlaminar approach at L3-L4using loss of resistance technique. Diagnostic injection of 55ml Isovue-M 200 demonstrates good epidural spread above and below the needle tip and crossing the midline. No intravascular or subarachnoid component. 20 ml of autologous blood were then administered as lumbar epidural blood patch. No immediate complication. IMPRESSION: 1. Technically successful lumbar epidural blood patch under fluoroscopy. The patient was counseled  on the importance of laying flat for an additional 24 hours and maintaining good p.o. fluid intake. Electronically Signed   By: Corlis Leak M.D.   On: 02/10/2022 16:26   ? ?Labs: ?BMET ?Recent Labs  ?Lab 02/06/22 ?0243 02/07/22 ?9323 02/08/22 ?5573 02/09/22 ?2202 02/10/22 ?0405 02/10/22 ?1623 02/11/22 ?0408  ?NA 139 137 139 139 141 141 139  ?K 3.4* 3.7 5.8* 4.0 5.9* 3.9 4.9  ?CL 109 112* 115* 112* 115* 116* 113*  ?CO2 20* 18* 15* 22 17* 17* 16*  ?GLUCOSE 99 106* 93 109* 116* 88 84  ?BUN 9 10 13 10 9 9 9   ?CREATININE 1.20* 1.82* 1.85* 1.69* 1.63* 1.49* 1.50*  ?CALCIUM 8.1* 8.0* 8.8* 8.2* 8.2* 8.2* 8.6*  ?PHOS  --   --   --  3.4 4.5  --   --   ? ?CBC ?Recent Labs  ?Lab 02/04/22 ?1811 02/06/22 ?0243 02/07/22 ?04/09/22 02/09/22 ?04/11/22  ?WBC 17.1* 12.4* 11.2* 10.5  ?NEUTROABS 14.9*  --   --   --   ?HGB 14.4 12.1 11.4* 11.6*  ?HCT 43.7 36.3 33.7* 33.1*  ?MCV 89.0 90.5 88.9 87.8  ?PLT 247 170 156 180  ? ?Medications:   ? ? doxycycline  100 mg Oral Q12H  ? enoxaparin (LOVENOX) injection  40 mg Subcutaneous Q24H  ? escitalopram  20 mg Oral Daily  ? levETIRAcetam  500 mg Oral BID  ? lidocaine  1 patch Transdermal Q24H  ? lisdexamfetamine  20 mg Oral Daily  ? pantoprazole  40 mg Oral QHS  ? sodium chloride flush  3 mL Intravenous Q12H  ? ?0623, MD ?02/11/2022, 10:03 AM ? ? ? ? ?

## 2022-02-11 NOTE — Plan of Care (Signed)

## 2022-02-11 NOTE — Plan of Care (Signed)
?  Problem: Education: ?Goal: Knowledge of General Education information will improve ?Description: Including pain rating scale, medication(s)/side effects and non-pharmacologic comfort measures ?Outcome: Progressing ?  ?Problem: Health Behavior/Discharge Planning: ?Goal: Ability to manage health-related needs will improve ?Outcome: Progressing ?  ?Problem: Activity: ?Goal: Risk for activity intolerance will decrease ?Outcome: Progressing ?  ?Problem: Safety: ?Goal: Ability to remain free from injury will improve ?Outcome: Progressing ?  ?Problem: Pain Managment: ?Goal: General experience of comfort will improve ?Outcome: Progressing ?  ?Problem: Skin Integrity: ?Goal: Risk for impaired skin integrity will decrease ?Outcome: Progressing ?  ?

## 2022-02-11 NOTE — Progress Notes (Signed)
?PROGRESS NOTE ?  ?Wendy Vega  ZOX:[id number]    DOB: [birth year]    DOA: 02/04/2022 ? ?PCP: Lynden AngLane, Elizabeth, NP  ? ?I have briefly reviewed patients previous medical records in [facility]. ? ?Chief Complaint  ?Patient presents with  ? Torticollis  ? ? ?Hospital Course:  ?26 year old female with medical history significant for anxiety, depression, seizure disorder on Keppra and ADHD, presented with complaints of generalized pains, acute onset of petechial rash of face, upper chest and neck pain and stiffness.  S/p lumbar puncture.  Admitted for suspected asymptomatic meningitis.  Improving from the standpoint.  Course complicated by AKI and hyperkalemia.  Nephrology consulted.  AKI improving.  Course complicated by post lumbar puncture headache, neurology consulted recommended blood patch for CSF leak.  S/p blood patch by IR on 5/8.  Finally headache/neck pain/back pain improving.  Nephrology signed off 5/9.  Hopeful DC home 5/10. ? ?Assessment and Plan: ?* Aseptic meningitis ?Presentation concerning for meningitis. ?Normal CTA of head and neck. ?CSF 42 K RBCs, 114 WBCs, predominantly neutrophilic, total protein 62. ?CSF culture and blood cultures x2: Negative to date. ?Started empirically on IV acyclovir, ceftriaxone and vancomycin. ?Follow serology for Ehrlichia (negative), HSV 1/2 PCR (negative), RMSF (negative).  Lyme's antibody negative.  Serum pregnancy test negative.  HIV and hepatitis panel negative. ?ID consulted and suspecting possible aseptic meningitis with concerns of RMSF given petechial rash.   ?Discontinued vancomycin and ceftriaxone as CSF studies probably suggest traumatic tap.   ?Skin rash resolved. ?HSV 1 and 2 PCR and RMSF serology are negative ?As per ID signoff 5/5: Acyclovir discontinued, recommend completing 7 days of doxycycline (completes course after 5/10 doses).  All isolation discontinued. ?Course complicated by post LP headache, failed conservative measures, finally underwent  blood patch by IR on 5/8.  Headache improving. ?Discussed with Dr. Elinor ParkinsonManandhar 5/9 and given that she could have other tickborne illnesses, recommended completing total 7 days of doxycycline. ?Clinically improved. ? ?Spinal puncture headache ?Had several days of progressive/severe headache and back pain, worsened post LP. ?Failed supportive treatment with IV fluids, pain meds. ?Finally had IR place blood patch on 5/8 with improvement. ?Supposed to be on bedrest until 2:30 PM today, then can gradually mobilize, have advised to wean off of strong pain meds including Oxy IR and Dilaudid. ? ?AKI (acute kidney injury) (HCC) ?Likely multifactorial: Dehydration from nausea and vomiting, ATN from contrast for CTA chest on admission and IV Toradol.  Vancomycin level normal. ?Discontinued IV Toradol.  Avoid nephrotoxic's. ?Creatinine progressively increased to a peak of 1.8.  Nonoliguric. ?FeNA 1.7. ?Nephrology following.  Due to metabolic acidosis and hyperkalemia, treated briefly with IV bicarb drip followed by maintenance IVF. ?Creatinine has improved and plateaued in the 1.5 range.  Nephrology has started bicarbonate, recommend renal diet at discharge, outpatient follow-up and 2 weeks when they will determine if bicarbonate can be stopped and diet can be liberalized. ? ?Hyperkalemia ?Due to AKI and poor clearance and replacement. ?Intermittent.  Resolved after doses of Veltassa and Lokelma. ?Renal diet.  Bicarbonate started today which may also help. ?Follow BMP in a.m. ?Outpatient nephrology follow-up. ? ?Elevated CK-resolved as of 02/08/2022 ?CK 551 on admission.  Unclear etiology. ?Improved, down to 261 on 5/4. ? ?Metabolic acidosis, normal anion gap (NAG) ?Unclear etiology. HCO3: 15 on 5/6 ??  Secondary to AKI and GI losses from vomiting. ?Persisting non-anion gap metabolic acidosis with bicarbonate in the 16-17 range. ?Nephrology has started oral bicarbonate supplements.  Follow BMP in AM. ? ?  Dehydration, mild-resolved  as of 02/08/2022 ?Resolved with IV fluids. ? ?Rash ?Resolved. ? ?Seizure disorder (HCC) ?Continue home dose of Keppra.  Seizure precautions. ?Keppra level low, however no change in dose. ? ?Anxiety and depression ?Continue home Lexapro and Vyvanse. ? ?Acute hyperglycemia-resolved as of 02/08/2022 ?Suspected due to a dose of Decadron in ED. ?FBS on a.m. labs 99. ? ?Hypokalemia-resolved as of 02/08/2022 ?Replaced. ? ? ?Body mass index is 29.83 kg/m?. ? ? ?DVT prophylaxis: enoxaparin (LOVENOX) injection 40 mg Start: 02/05/22 0815   ?  Code Status: Full Code:  ?Family Communication: Mother at bedside. ?Disposition:  ?Status is: Inpatient ?Hopefully can mobilize after 24-hour bedrest post blood patch placement, wean off IV/p.o. opioids, may be able to eat better, ensure stability of renal function in a.m. and possible DC home 5/10. ?  ? ?Consultants:   ?Infectious disease ?Nephrology ?Interventional radiology ? ?Procedures:   ?LP ?Blood patch placement by IR 5/8. ? ?Antimicrobials:   ?As above ? ? ?Subjective:  ?Feels much better.  No headache.  Still some neck soreness and low back pain.  However able to sleep through the night.  Continue to use IV Dilaudid preemptively for pain.  No nausea or vomiting.  Feels hungry this morning and is ordered some chicken noodle soup.  Overall feels much better than yesterday. ?Objective:  ? ?Vitals:  ? 02/10/22 0712 02/10/22 1053 02/10/22 2052 02/11/22 0128  ?BP: (!) 165/104 (!) 147/102 (!) 169/98 (!) 158/105  ?Pulse: (!) 53 61 66 62  ?Resp: 19 20 20 20   ?Temp: 98 ?F (36.7 ?C) 97.7 ?F (36.5 ?C) 98.7 ?F (37.1 ?C) 98.3 ?F (36.8 ?C)  ?TempSrc:  Oral Oral Oral  ?SpO2: 96% 96% 97% 96%  ?Weight:      ?Height:      ? ? ?General exam: Young female, moderately built and overweight lying comfortably supine in bed without distress.  Appears in good spirits.  Looks much improved compared to the last couple of days. ?Neck: Mildly restricted flexion and lateral movements, mild but  improving. ?Respiratory system: Clear to auscultation.  No increased work of breathing. ?Cardiovascular system: S1 & S2 heard, RRR. No JVD, murmurs, rubs, gallops or clicks. No pedal edema.  Not on telemetry. ?Gastrointestinal system: Abdomen is nondistended, soft and nontender. No organomegaly or masses felt. Normal bowel sounds heard. ?Central nervous system: Alert and oriented.  No focal deficits. ?Extremities: Symmetric 5 x 5 power. ?Skin: Rash of face, right lower neck and left upper chest have resolved. ?Psychiatry: Judgement and insight appear normal. Mood & affect appropriate.  ? ?Data Reviewed:   ?I have personally reviewed following labs and imaging studies ? ?CBC: ?Recent Labs  ?Lab 02/04/22 ?1811 02/06/22 ?0243 02/07/22 ?04/09/22 02/09/22 ?04/11/22  ?WBC 17.1* 12.4* 11.2* 10.5  ?NEUTROABS 14.9*  --   --   --   ?HGB 14.4 12.1 11.4* 11.6*  ?HCT 43.7 36.3 33.7* 33.1*  ?MCV 89.0 90.5 88.9 87.8  ?PLT 247 170 156 180  ? ?Basic Metabolic Panel: ?Recent Labs  ?Lab 02/08/22 ?04/10/22 02/09/22 ?04/11/22 02/10/22 ?0405 02/10/22 ?1623 02/11/22 ?0408  ?NA 139 139 141 141 139  ?K 5.8* 4.0 5.9* 3.9 4.9  ?CL 115* 112* 115* 116* 113*  ?CO2 15* 22 17* 17* 16*  ?GLUCOSE 93 109* 116* 88 84  ?BUN 13 10 9 9 9   ?CREATININE 1.85* 1.69* 1.63* 1.49* 1.50*  ?CALCIUM 8.8* 8.2* 8.2* 8.2* 8.6*  ?PHOS  --  3.4 4.5  --   --   ? ?  Liver Function Tests: ?Recent Labs  ?Lab 02/04/22 ?1811 02/09/22 ?4580 02/10/22 ?0405  ?AST 20  --   --   ?ALT 15  --   --   ?ALKPHOS 62  --   --   ?BILITOT 0.3  --   --   ?PROT 7.5  --   --   ?ALBUMIN 4.6 2.8* 2.7*  ? ?CBG: ?No results for input(s): GLUCAP in the last 168 hours. ?Microbiology Studies:  ? ?Recent Results (from the past 240 hour(s))  ?Resp Panel by RT-PCR (Flu A&B, Covid) Nasopharyngeal Swab     Status: None  ? Collection Time: 02/04/22  6:11 PM  ? Specimen: Nasopharyngeal Swab; Nasopharyngeal(NP) swabs in vial transport medium  ?Result Value Ref Range Status  ? SARS Coronavirus 2 by RT PCR NEGATIVE NEGATIVE  Final  ?  Comment: (NOTE) ?SARS-CoV-2 target nucleic acids are NOT DETECTED. ? ?The SARS-CoV-2 RNA is generally detectable in upper respiratory ?specimens during the acute phase of infection. The lowest ?concentration of SARS-C

## 2022-02-11 NOTE — Assessment & Plan Note (Addendum)
Had several days of progressive/severe headache and back pain, worsened post LP. ?Failed supportive treatment with IV fluids, pain meds. ?Finally had IR place blood patch on 5/8 with improvement. ?

## 2022-02-12 ENCOUNTER — Other Ambulatory Visit (HOSPITAL_COMMUNITY): Payer: Self-pay

## 2022-02-12 LAB — BASIC METABOLIC PANEL
Anion gap: 7 (ref 5–15)
BUN: 10 mg/dL (ref 6–20)
CO2: 21 mmol/L — ABNORMAL LOW (ref 22–32)
Calcium: 8.5 mg/dL — ABNORMAL LOW (ref 8.9–10.3)
Chloride: 111 mmol/L (ref 98–111)
Creatinine, Ser: 1.38 mg/dL — ABNORMAL HIGH (ref 0.44–1.00)
GFR, Estimated: 54 mL/min — ABNORMAL LOW (ref >60)
Glucose, Bld: 89 mg/dL (ref 70–99)
Potassium: 3.6 mmol/L (ref 3.5–5.1)
Sodium: 139 mmol/L (ref 135–145)

## 2022-02-12 LAB — CBC
HCT: 37.7 % (ref 36.0–46.0)
Hemoglobin: 13.4 g/dL (ref 12.0–15.0)
MCH: 30.6 pg (ref 26.0–34.0)
MCHC: 35.5 g/dL (ref 30.0–36.0)
MCV: 86.1 fL (ref 80.0–100.0)
Platelets: 224 10*3/uL (ref 150–400)
RBC: 4.38 MIL/uL (ref 3.87–5.11)
RDW: 11.9 % (ref 11.5–15.5)
WBC: 9.6 10*3/uL (ref 4.0–10.5)
nRBC: 0 % (ref 0.0–0.2)

## 2022-02-12 MED ORDER — DOXYCYCLINE HYCLATE 100 MG PO TABS
100.0000 mg | ORAL_TABLET | Freq: Two times a day (BID) | ORAL | 0 refills | Status: DC
  Filled 2022-02-12: qty 1, 1d supply, fill #0

## 2022-02-12 MED ORDER — PANTOPRAZOLE SODIUM 40 MG PO TBEC
40.0000 mg | DELAYED_RELEASE_TABLET | Freq: Every day | ORAL | 0 refills | Status: DC
  Filled 2022-02-12: qty 14, 14d supply, fill #0

## 2022-02-12 MED ORDER — SODIUM BICARBONATE 650 MG PO TABS
650.0000 mg | ORAL_TABLET | Freq: Two times a day (BID) | ORAL | 0 refills | Status: DC
  Filled 2022-02-12: qty 60, 30d supply, fill #0

## 2022-02-12 NOTE — Plan of Care (Signed)
?  Problem: Education: ?Goal: Knowledge of General Education information will improve ?Description: Including pain rating scale, medication(s)/side effects and non-pharmacologic comfort measures ?02/12/2022 1021 by Alver Fisher, RN ?Outcome: Adequate for Discharge ?02/12/2022 1020 by Alver Fisher, RN ?Outcome: Adequate for Discharge ?02/12/2022 1019 by Alver Fisher, RN ?Outcome: Progressing ?  ?Problem: Health Behavior/Discharge Planning: ?Goal: Ability to manage health-related needs will improve ?02/12/2022 1021 by Alver Fisher, RN ?Outcome: Adequate for Discharge ?02/12/2022 1020 by Alver Fisher, RN ?Outcome: Adequate for Discharge ?02/12/2022 1019 by Alver Fisher, RN ?Outcome: Progressing ?  ?Problem: Clinical Measurements: ?Goal: Ability to maintain clinical measurements within normal limits will improve ?02/12/2022 1021 by Alver Fisher, RN ?Outcome: Adequate for Discharge ?02/12/2022 1020 by Alver Fisher, RN ?Outcome: Adequate for Discharge ?02/12/2022 1019 by Alver Fisher, RN ?Outcome: Progressing ?Goal: Will remain free from infection ?02/12/2022 1021 by Alver Fisher, RN ?Outcome: Adequate for Discharge ?02/12/2022 1020 by Alver Fisher, RN ?Outcome: Adequate for Discharge ?02/12/2022 1019 by Alver Fisher, RN ?Outcome: Progressing ?Goal: Diagnostic test results will improve ?02/12/2022 1021 by Alver Fisher, RN ?Outcome: Adequate for Discharge ?02/12/2022 1020 by Alver Fisher, RN ?Outcome: Adequate for Discharge ?02/12/2022 1019 by Alver Fisher, RN ?Outcome: Progressing ?Goal: Respiratory complications will improve ?02/12/2022 1021 by Alver Fisher, RN ?Outcome: Adequate for Discharge ?02/12/2022 1020 by Alver Fisher, RN ?Outcome: Adequate for Discharge ?02/12/2022 1019 by Alver Fisher, RN ?Outcome: Progressing ?Goal: Cardiovascular complication will be avoided ?02/12/2022 1021 by Alver Fisher,  RN ?Outcome: Adequate for Discharge ?02/12/2022 1020 by Alver Fisher, RN ?Outcome: Adequate for Discharge ?02/12/2022 1019 by Alver Fisher, RN ?Outcome: Progressing ?  ?Problem: Activity: ?Goal: Risk for activity intolerance will decrease ?02/12/2022 1021 by Alver Fisher, RN ?Outcome: Adequate for Discharge ?02/12/2022 1020 by Alver Fisher, RN ?Outcome: Adequate for Discharge ?02/12/2022 1019 by Alver Fisher, RN ?Outcome: Progressing ?  ?Problem: Nutrition: ?Goal: Adequate nutrition will be maintained ?02/12/2022 1021 by Alver Fisher, RN ?Outcome: Adequate for Discharge ?02/12/2022 1020 by Alver Fisher, RN ?Outcome: Adequate for Discharge ?02/12/2022 1019 by Alver Fisher, RN ?Outcome: Progressing ?  ?Problem: Coping: ?Goal: Level of anxiety will decrease ?02/12/2022 1021 by Alver Fisher, RN ?Outcome: Adequate for Discharge ?02/12/2022 1020 by Alver Fisher, RN ?Outcome: Adequate for Discharge ?02/12/2022 1019 by Alver Fisher, RN ?Outcome: Progressing ?  ?Problem: Pain Managment: ?Goal: General experience of comfort will improve ?02/12/2022 1021 by Alver Fisher, RN ?Outcome: Adequate for Discharge ?02/12/2022 1020 by Alver Fisher, RN ?Outcome: Adequate for Discharge ?02/12/2022 1019 by Alver Fisher, RN ?Outcome: Progressing ?  ?Problem: Safety: ?Goal: Ability to remain free from injury will improve ?02/12/2022 1021 by Alver Fisher, RN ?Outcome: Adequate for Discharge ?02/12/2022 1020 by Alver Fisher, RN ?Outcome: Adequate for Discharge ?02/12/2022 1019 by Alver Fisher, RN ?Outcome: Progressing ?  ?Problem: Skin Integrity: ?Goal: Risk for impaired skin integrity will decrease ?02/12/2022 1021 by Alver Fisher, RN ?Outcome: Adequate for Discharge ?02/12/2022 1020 by Alver Fisher, RN ?Outcome: Adequate for Discharge ?02/12/2022 1019 by Alver Fisher, RN ?Outcome: Progressing ?  ?

## 2022-02-12 NOTE — Discharge Summary (Signed)
?[facility] ? ?Physician Discharge Summary  ? ?Patient ID: ?Wendy Vega ?MRN: [id number] ?DOB/AGE: 29-Aug-1996 26 y.o. ? ?Admit date: 02/04/2022 ?Discharge date: 02/12/2022   ? ?PCP: Lynden Ang, NP ? ?DISCHARGE DIAGNOSES:  ?Principal Problem: ?  Aseptic meningitis ?Active Problems: ?  Spinal puncture headache ?  AKI (acute kidney injury) (HCC) ?  Elevated CK ?  Hyperkalemia ?  Metabolic acidosis, normal anion gap (NAG) ?  Rash ?  Seizure disorder (HCC) ?  Anxiety and depression ?  ADHD ? ?Resolved Problems: ?  Acute hyperglycemia ?  Hypokalemia ? ? ?RECOMMENDATIONS FOR OUTPATIENT FOLLOW UP: ?Nephrology to arrange outpatient follow-up ? ? ?Home Health: None ?Equipment/Devices: None ? ?CODE STATUS: Full code ? ?DISCHARGE CONDITION: fair ? ?Diet recommendation: Regular as tolerated ? ?INITIAL HISTORY: ?26 year old female with medical history significant for anxiety, depression, seizure disorder on Keppra and ADHD, presented with complaints of generalized pains, acute onset of petechial rash of face, upper chest and neck pain and stiffness.  S/p lumbar puncture.  Admitted for suspected asymptomatic meningitis.  Improving from the standpoint.  Course complicated by AKI and hyperkalemia.  Nephrology consulted.  AKI improving.  Course complicated by post lumbar puncture headache, neurology consulted recommended blood patch for CSF leak.  S/p blood patch by IR on 5/8.  Finally headache/neck pain/back pain improving.  Nephrology signed off 5/9.  Hopeful DC home 5/10. ? ?Consultations: ?Infectious disease ?Nephrology ? ?Procedures: ?Lumbar puncture ? ? ?HOSPITAL COURSE:  ? ?* Aseptic meningitis ?Presentation concerning for meningitis. ?Normal CTA of head and neck. ?CSF 42 K RBCs, 114 WBCs, predominantly neutrophilic, total protein 62. ?CSF culture and blood cultures x2: Negative to date. ?Started empirically on IV acyclovir, ceftriaxone and vancomycin. ?Follow serology for Ehrlichia (negative), HSV 1/2 PCR  (negative), RMSF (negative).  Lyme's antibody negative.  Serum pregnancy test negative.  HIV and hepatitis panel negative. ?ID consulted and suspecting possible aseptic meningitis with concerns of RMSF given petechial rash.   ?Discontinued vancomycin and ceftriaxone as CSF studies probably suggest traumatic tap.   ?Skin rash resolved. ?HSV 1 and 2 PCR and RMSF serology are negative ?As per ID signoff 5/5: Acyclovir discontinued, recommend completing 7 days of doxycycline (completes course after 5/10 doses).  All isolation discontinued. ?Course complicated by post LP headache, failed conservative measures, finally underwent blood patch by IR on 5/8.  Headache is now resolved. ?Discussed with Dr. Elinor Parkinson 5/9 and given that she could have other tickborne illnesses, recommended completing total 7 days of doxycycline. ?Clinically improved.  ? ?Spinal puncture headache ?Had several days of progressive/severe headache and back pain, worsened post LP. ?Failed supportive treatment with IV fluids, pain meds. ?Finally had IR place blood patch on 5/8 with improvement. ? ?AKI (acute kidney injury) (HCC) ?Likely multifactorial: Dehydration from nausea and vomiting, ATN from contrast for CTA chest on admission and IV Toradol.   ?Renal function has improved.  NSAIDs were discontinued.  Patient was seen by nephrology.  Given IV sodium bicarbonate infusion.  Will be discharged on oral sodium bicarbonate.  Nephrology will arrange outpatient follow-up. ? ?Hyperkalemia ?Due to AKI and poor clearance and replacement. ?Intermittent.  Resolved after doses of Veltassa and Lokelma. ? ?Elevated CK ?CK 551 on admission.  Unclear etiology. ?Improved, down to 261 on 5/4. ? ?Metabolic acidosis, normal anion gap (NAG) ?Unclear etiology. HCO3: 15 on 5/6 ??  Secondary to AKI and GI losses from vomiting. ?Persisting non-anion gap metabolic acidosis with bicarbonate in the 16-17 range. ?Nephrology has started oral bicarbonate supplements.  Outpatient monitoring. ? ?Rash ?Resolved. ? ?Seizure disorder (HCC) ?Continue home dose of Keppra.   ? ?Anxiety and depression ?Continue home Lexapro and Vyvanse. ? ?Acute hyperglycemia-resolved as of 02/08/2022 ?Suspected due to a dose of Decadron in ED. ?FBS on a.m. labs 99. ? ?Hypokalemia-resolved as of 02/08/2022 ?Replaced. ? ? ?Patient is stable.  Feels much better.  Headache is completely resolved.  Discussed with patient and her mother.  Okay for discharge today. ? ? ?PERTINENT LABS: ? ?The results of significant diagnostics from this hospitalization (including imaging, microbiology, ancillary and laboratory) are listed below for reference.   ? ?Microbiology: ?Recent Results (from the past 240 hour(s))  ?Resp Panel by RT-PCR (Flu A&B, Covid) Nasopharyngeal Swab     Status: None  ? Collection Time: 02/04/22  6:11 PM  ? Specimen: Nasopharyngeal Swab; Nasopharyngeal(NP) swabs in vial transport medium  ?Result Value Ref Range Status  ? SARS Coronavirus 2 by RT PCR NEGATIVE NEGATIVE Final  ?  Comment: (NOTE) ?SARS-CoV-2 target nucleic acids are NOT DETECTED. ? ?The SARS-CoV-2 RNA is generally detectable in upper respiratory ?specimens during the acute phase of infection. The lowest ?concentration of SARS-CoV-2 viral copies this assay can detect is ?138 copies/mL. A negative result does not preclude SARS-Cov-2 ?infection and should not be used as the sole basis for treatment or ?other patient management decisions. A negative result may occur with  ?improper specimen collection/handling, submission of specimen other ?than nasopharyngeal swab, presence of viral mutation(s) within the ?areas targeted by this assay, and inadequate number of viral ?copies(<138 copies/mL). A negative result must be combined with ?clinical observations, patient history, and epidemiological ?information. The expected result is Negative. ? ?Fact Sheet for Patients:  ?BloggerCourse.comhttps://www.fda.gov/media/152166/download ? ?Fact Sheet for Healthcare  Providers:  ?SeriousBroker.ithttps://www.fda.gov/media/152162/download ? ?This test is no t yet approved or cleared by the [place] FDA and  ?has been authorized for detection and/or diagnosis of SARS-CoV-2 by ?FDA under an Emergency Use Authorization (EUA). This EUA will remain  ?in effect (meaning this test can be used) for the duration of the ?COVID-19 declaration under Section 564(b)(1) of the Act, 21 ?U.S.C.section 360bbb-3(b)(1), unless the authorization is terminated  ?or revoked sooner.  ? ? ?  ? Influenza A by PCR NEGATIVE NEGATIVE Final  ? Influenza B by PCR NEGATIVE NEGATIVE Final  ?  Comment: (NOTE) ?The Xpert Xpress SARS-CoV-2/FLU/RSV plus assay is intended as an aid ?in the diagnosis of influenza from Nasopharyngeal swab specimens and ?should not be used as a sole basis for treatment. Nasal washings and ?aspirates are unacceptable for Xpert Xpress SARS-CoV-2/FLU/RSV ?testing. ? ?Fact Sheet for Patients: ?BloggerCourse.comhttps://www.fda.gov/media/152166/download ? ?Fact Sheet for Healthcare Providers: ?SeriousBroker.ithttps://www.fda.gov/media/152162/download ? ?This test is not yet approved or cleared by the [place] FDA and ?has been authorized for detection and/or diagnosis of SARS-CoV-2 by ?FDA under an Emergency Use Authorization (EUA). This EUA will remain ?in effect (meaning this test can be used) for the duration of the ?COVID-19 declaration under Section 564(b)(1) of the Act, 21 U.S.C. ?section 360bbb-3(b)(1), unless the authorization is terminated or ?revoked. ? ?Performed at [facility], [address], ?[place], [place] [address] ?  ?Blood culture (routine x 2)     Status: None  ? Collection Time: 02/04/22  6:23 PM  ? Specimen: BLOOD  ?Result Value Ref Range Status  ? Specimen Description   Final  ?  BLOOD RIGHT ANTECUBITAL ?Performed at [facility], [address], [place], [place] [address] ?  ? Special Requests   Final  ?  Blood Culture adequate volume BOTTLES DRAWN AEROBIC AND  ANAEROBIC ?Performed at [facility], [address], [place], [place] [address] ?  ? Culture   Final  ?  NO GROWTH 5 DAYS ?Performed at [facility], [address] [place], [place] [address] ?  ? Repo

## 2022-02-12 NOTE — Plan of Care (Signed)
?  Problem: Education: ?Goal: Knowledge of General Education information will improve ?Description: Including pain rating scale, medication(s)/side effects and non-pharmacologic comfort measures ?02/12/2022 1020 by Alver Fisher, RN ?Outcome: Adequate for Discharge ?02/12/2022 1019 by Alver Fisher, RN ?Outcome: Progressing ?  ?Problem: Health Behavior/Discharge Planning: ?Goal: Ability to manage health-related needs will improve ?02/12/2022 1020 by Alver Fisher, RN ?Outcome: Adequate for Discharge ?02/12/2022 1019 by Alver Fisher, RN ?Outcome: Progressing ?  ?Problem: Clinical Measurements: ?Goal: Ability to maintain clinical measurements within normal limits will improve ?02/12/2022 1020 by Alver Fisher, RN ?Outcome: Adequate for Discharge ?02/12/2022 1019 by Alver Fisher, RN ?Outcome: Progressing ?Goal: Will remain free from infection ?02/12/2022 1020 by Alver Fisher, RN ?Outcome: Adequate for Discharge ?02/12/2022 1019 by Alver Fisher, RN ?Outcome: Progressing ?Goal: Diagnostic test results will improve ?02/12/2022 1020 by Alver Fisher, RN ?Outcome: Adequate for Discharge ?02/12/2022 1019 by Alver Fisher, RN ?Outcome: Progressing ?Goal: Respiratory complications will improve ?02/12/2022 1020 by Alver Fisher, RN ?Outcome: Adequate for Discharge ?02/12/2022 1019 by Alver Fisher, RN ?Outcome: Progressing ?Goal: Cardiovascular complication will be avoided ?02/12/2022 1020 by Alver Fisher, RN ?Outcome: Adequate for Discharge ?02/12/2022 1019 by Alver Fisher, RN ?Outcome: Progressing ?  ?Problem: Activity: ?Goal: Risk for activity intolerance will decrease ?02/12/2022 1020 by Alver Fisher, RN ?Outcome: Adequate for Discharge ?02/12/2022 1019 by Alver Fisher, RN ?Outcome: Progressing ?  ?Problem: Nutrition: ?Goal: Adequate nutrition will be maintained ?02/12/2022 1020 by Alver Fisher, RN ?Outcome: Adequate for  Discharge ?02/12/2022 1019 by Alver Fisher, RN ?Outcome: Progressing ?  ?Problem: Coping: ?Goal: Level of anxiety will decrease ?02/12/2022 1020 by Alver Fisher, RN ?Outcome: Adequate for Discharge ?02/12/2022 1019 by Alver Fisher, RN ?Outcome: Progressing ?  ?Problem: Pain Managment: ?Goal: General experience of comfort will improve ?02/12/2022 1020 by Alver Fisher, RN ?Outcome: Adequate for Discharge ?02/12/2022 1019 by Alver Fisher, RN ?Outcome: Progressing ?  ?Problem: Safety: ?Goal: Ability to remain free from injury will improve ?02/12/2022 1020 by Alver Fisher, RN ?Outcome: Adequate for Discharge ?02/12/2022 1019 by Alver Fisher, RN ?Outcome: Progressing ?  ?Problem: Skin Integrity: ?Goal: Risk for impaired skin integrity will decrease ?02/12/2022 1020 by Alver Fisher, RN ?Outcome: Adequate for Discharge ?02/12/2022 1019 by Alver Fisher, RN ?Outcome: Progressing ?  ?

## 2022-02-12 NOTE — Plan of Care (Signed)

## 2022-02-12 NOTE — TOC Transition Note (Addendum)
Transition of Care (TOC) - CM/SW Discharge Note ? ? ?Patient Details  ?Name: Wendy Vega ?MRN: [id number] ?Date of Birth: July 04, 1996 ? ?Transition of Care (TOC) CM/SW Contact:  ?Leone Haven, RN ?Phone Number: ?02/12/2022, 9:51 AM ? ? ?Clinical Narrative:    ?Patient is for dc today,she would like to get PCP apt at [facility] with Corinda Gubler for PCP.  NCM will schedule apt. Her transport is here. She has no other needs.  NCM will call her on her cell to give her apt time. Apt made on AVS. ? ? ?  ?  ? ? ?Patient Goals and CMS Choice ?  ?  ?  ? ?Discharge Placement ?  ?           ?  ?  ?  ?  ? ?Discharge Plan and Services ?  ?  ?           ?  ?  ?  ?  ?  ?  ?  ?  ?  ?  ? ?Social Determinants of Health (SDOH) Interventions ?  ? ? ?Readmission Risk Interventions ?   ? View : No data to display.  ?  ?  ?  ? ? ? ? ? ?

## 2022-02-20 ENCOUNTER — Ambulatory Visit (INDEPENDENT_AMBULATORY_CARE_PROVIDER_SITE_OTHER): Payer: Commercial Managed Care - PPO | Admitting: Family

## 2022-02-20 ENCOUNTER — Encounter: Payer: Self-pay | Admitting: Family

## 2022-02-20 VITALS — BP 126/88 | HR 74 | Temp 98.2°F | Ht 69.0 in | Wt 190.4 lb

## 2022-02-20 DIAGNOSIS — N179 Acute kidney failure, unspecified: Secondary | ICD-10-CM

## 2022-02-20 DIAGNOSIS — Z8661 Personal history of infections of the central nervous system: Secondary | ICD-10-CM

## 2022-02-20 NOTE — Progress Notes (Signed)
New Patient Office Visit  Subjective:  Patient ID: ANDREKA STUCKI, adult    DOB: 02/12/1996  Age: 26 y.o. MRN: [id number]  CC:  Chief Complaint  Patient presents with   Establish Care   Rash    Pt was diagnosed in ED with meningitis. May 2-May 10th in hospital.    HPI MAISA BEDINGFIELD presents for establishing care today.  Meningitis/Hospital F/U:  pt hospitalized from 5/2-5/10. Reports sudden onset of sx, fatigue, took a nap, woke up with neck pain/stiffness, and rash all over face, neck, chest, went to ER. Advised at d/c to f/u with Nephrology 5/24. Reports finishing DOXY, also given protonix for 2 weeks, then d/c, and sodium bicarbonate to continue until seen by Nephrology. reports HA/neck pain, stiffness is resolving, now having some mild sacral pain. Facial and chest rash completely resolved.  Assessment & Plan:   Problem List Items Addressed This Visit       Genitourinary   AKI (acute kidney injury) (HCC) pt has a f/u appt with nephrology on 5/24, will defer to them for lab work. Pt feeling well, advised on drinking 2L water qd.     Other   History of viral meningitis - Primary    Hospitalized for 8 days, sudden onset of rash, stiff & painful neck. Per vaccine records she received one dose of Meningococal vaccine, but not any subsequent boosters. Advised to schedule nurse visit to receive this and updated TD after she sees Nephrology.       Outpatient Medications Prior to Visit  Medication Sig Dispense Refill   escitalopram (LEXAPRO) 20 MG tablet TAKE 1 TABLET BY MOUTH DAILY. (Patient taking differently: Take 20 mg by mouth daily.) 90 tablet 3   levETIRAcetam (KEPPRA) 500 MG tablet Take 500 mg by mouth 2 (two) times daily.     lisdexamfetamine (VYVANSE) 50 MG capsule      pantoprazole (PROTONIX) 40 MG tablet Take 1 tablet (40 mg total) by mouth at bedtime. 14 tablet 0   sodium bicarbonate 650 MG tablet Take 1 tablet (650 mg total) by mouth 2 (two) times daily. 60  tablet 0   doxycycline (VIBRA-TABS) 100 MG tablet Take 1 tablet (100 mg total) by mouth every 12 (twelve) hours. 1 tablet 0   lisdexamfetamine (VYVANSE) 20 MG capsule Take 40 mg by mouth daily.     No facility-administered medications prior to visit.    Past Medical History:  Diagnosis Date   AKI (acute kidney injury) (HCC)    Allergy 1998   Possible sulfa allergy   Anxiety    Depression    GERD (gastroesophageal reflux disease) 2018   No current issues   Hyperkalemia 02/08/2022   Metabolic acidosis, normal anion gap (NAG) 02/08/2022   Rash 02/05/2022   Seizures (HCC) July 2021   Medicated. Over 6 mo since last seizure.    Past Surgical History:  Procedure Laterality Date   IR FLUORO GUIDED NEEDLE PLC ASPIRATION/INJECTION LOC  02/10/2022   left arm surgery      WISDOM TOOTH EXTRACTION      Objective:   Today's Vitals: BP 126/88 (BP Location: Left Arm, Patient Position: Sitting, Cuff Size: Large)   Pulse 74   Temp 98.2 F (36.8 C) (Temporal)   Ht 5\' 9"  (1.753 m)   Wt 190 lb 6 oz (86.4 kg)   LMP 02/05/2022 (Approximate)   SpO2 97%   BMI 28.11 kg/m   Physical Exam Vitals and nursing note reviewed.  Constitutional:  Appearance: Normal appearance.  Cardiovascular:     Rate and Rhythm: Normal rate and regular rhythm.  Pulmonary:     Effort: Pulmonary effort is normal.     Breath sounds: Normal breath sounds.  Musculoskeletal:        General: Normal range of motion.  Skin:    General: Skin is warm and dry.  Neurological:     Mental Status: MARELI ANTUNES is alert.     Cranial Nerves: Cranial nerves 2-12 are intact.     Sensory: Sensation is intact.     Motor: Motor function is intact.     Coordination: Coordination is intact.     Gait: Gait is intact.  Psychiatric:        Mood and Affect: Mood normal.        Behavior: Behavior normal.    Follow-up: Return for any future concerns.   Dulce Sellar, NP

## 2022-02-20 NOTE — Patient Instructions (Addendum)
Welcome to [facility], It was a pleasure meeting you today!  Be sure to keep your appointment with Nephrology, I will let them check your labs, let them know I am your primary provider and ask them to send over their notes.  As discussed, start taking 1/2 pill of the protonix daily and then stop taking, if any stomach upset after doing this, let me know.  Per your vaccine record, you received one meningitis shot, but not the 2nd. Also looks like you are due for an updated tetanus shot.  After you see Nephrology, I recommend calling here to receive these vaccines, this can be done via nurse visit.        PLEASE NOTE: If you had any LAB tests please let us know if you have not heard back within a few days. You may see your results on MyChart before we have a chance to review them but we will give you a call once they are reviewed by [place]. If we ordered any REFERRALS today, please let us know if you have not heard from their office within the next week.  Let us know through MyChart if you are needing REFILLS, or have your pharmacy send [place] the request. You can also use MyChart to communicate with me or any office staff.

## 2022-02-20 NOTE — Assessment & Plan Note (Signed)
Hospitalized for 8 days, sudden onset of rash, stiff & painful neck. Per vaccine records she received one dose of Meningococal vaccine, but not any subsequent boosters. Advised to schedule nurse visit to receive this and updated TD after she sees Nephrology.

## 2022-02-27 ENCOUNTER — Other Ambulatory Visit: Payer: Self-pay | Admitting: Adult Health

## 2022-02-27 ENCOUNTER — Telehealth: Payer: Self-pay | Admitting: Adult Health

## 2022-02-27 DIAGNOSIS — F909 Attention-deficit hyperactivity disorder, unspecified type: Secondary | ICD-10-CM

## 2022-02-27 MED ORDER — LISDEXAMFETAMINE DIMESYLATE 50 MG PO CAPS: 50.0000 mg | ORAL_CAPSULE | Freq: Every day | ORAL | 0 refills | Status: DC

## 2022-02-27 NOTE — Telephone Encounter (Signed)
Script sent  

## 2022-02-27 NOTE — Telephone Encounter (Signed)
Next visit is 06/25/22. Wendy Vega said that her Vyvanse 50 mg is working fine and helping her. She would like a refill called in to:  [facility] [address], [place] - [address]  Phone:  [phone]  Fax:  [phone]

## 2022-04-14 ENCOUNTER — Telehealth: Payer: Self-pay | Admitting: Adult Health

## 2022-04-14 ENCOUNTER — Other Ambulatory Visit: Payer: Self-pay

## 2022-04-14 DIAGNOSIS — F909 Attention-deficit hyperactivity disorder, unspecified type: Secondary | ICD-10-CM

## 2022-04-14 MED ORDER — LISDEXAMFETAMINE DIMESYLATE 50 MG PO CAPS: 50.0000 mg | ORAL_CAPSULE | Freq: Every day | ORAL | 0 refills | Status: DC

## 2022-04-14 NOTE — Telephone Encounter (Signed)
Pended.

## 2022-04-14 NOTE — Telephone Encounter (Signed)
Pt would like a refill of Vyvanse sent to [facility] [address].  Next appt 9/20

## 2022-04-15 ENCOUNTER — Other Ambulatory Visit: Payer: Self-pay

## 2022-04-15 DIAGNOSIS — F909 Attention-deficit hyperactivity disorder, unspecified type: Secondary | ICD-10-CM

## 2022-04-15 MED ORDER — LISDEXAMFETAMINE DIMESYLATE 50 MG PO CAPS: 50.0000 mg | ORAL_CAPSULE | Freq: Every day | ORAL | 0 refills | Status: DC

## 2022-05-16 ENCOUNTER — Other Ambulatory Visit: Payer: Self-pay

## 2022-05-16 ENCOUNTER — Telehealth: Payer: Self-pay | Admitting: Adult Health

## 2022-05-16 DIAGNOSIS — F909 Attention-deficit hyperactivity disorder, unspecified type: Secondary | ICD-10-CM

## 2022-05-16 MED ORDER — LISDEXAMFETAMINE DIMESYLATE 50 MG PO CAPS: 50.0000 mg | ORAL_CAPSULE | Freq: Every day | ORAL | 0 refills | Status: DC

## 2022-05-16 NOTE — Telephone Encounter (Signed)
Pt called and needs a refill on her vyvanse 50 mg. Pharmacy is [facility] [address]

## 2022-05-16 NOTE — Telephone Encounter (Signed)
Pended.

## 2022-06-25 ENCOUNTER — Other Ambulatory Visit (HOSPITAL_COMMUNITY): Payer: Self-pay

## 2022-06-25 ENCOUNTER — Encounter: Payer: Self-pay | Admitting: Adult Health

## 2022-06-25 ENCOUNTER — Ambulatory Visit (INDEPENDENT_AMBULATORY_CARE_PROVIDER_SITE_OTHER): Payer: Commercial Managed Care - PPO | Admitting: Adult Health

## 2022-06-25 DIAGNOSIS — F5081 Binge eating disorder: Secondary | ICD-10-CM | POA: Diagnosis not present

## 2022-06-25 DIAGNOSIS — F909 Attention-deficit hyperactivity disorder, unspecified type: Secondary | ICD-10-CM

## 2022-06-25 DIAGNOSIS — F411 Generalized anxiety disorder: Secondary | ICD-10-CM | POA: Diagnosis not present

## 2022-06-25 DIAGNOSIS — F50819 Binge eating disorder, unspecified: Secondary | ICD-10-CM

## 2022-06-25 DIAGNOSIS — F331 Major depressive disorder, recurrent, moderate: Secondary | ICD-10-CM | POA: Diagnosis not present

## 2022-06-25 MED ORDER — ESCITALOPRAM OXALATE 20 MG PO TABS
ORAL_TABLET | ORAL | 3 refills | Status: AC
  Filled 2022-06-25 – 2022-07-09 (×2): qty 90, 90d supply, fill #0

## 2022-06-25 MED ORDER — LISDEXAMFETAMINE DIMESYLATE 20 MG PO CAPS
ORAL_CAPSULE | ORAL | 0 refills | Status: DC
  Filled 2022-06-25 – 2022-07-09 (×3): qty 60, 30d supply, fill #0

## 2022-06-25 NOTE — Progress Notes (Signed)
Wendy Vega [id number] 03/20/1996 26 y.o.  Subjective:   Patient ID:  Wendy Vega is a 26 y.o. (DOB [birth year]) adult.  Chief Complaint: No chief complaint on file.   HPI Wendy Vega presents to the office today for follow-up of MDD, GAD, Binge eating disorder, ADHD.  Describes mood today as "ok". Pleasant. Mood symptoms - denies depression, irritability and anxiety. Mood is consistent. Stating "I'm doing pretty good". Feels like medications continue to work well. Recently hospitalized for meningitis - now recovered. Seeing Neurology for epilepsy - Keppra. Stable interest and motivation. Taking medications as prescribed.  Energy levels stable. Active, does not have a regular exercise routine. Walking. Enjoys some usual interests and activities. Single. Lives alone with cat - "Ray". Not dating.  Appetite adequate. Weight stable.   Sleeps well most nights. Averages 8 to 10 hours. Focus and concentration stable. Completing tasks. Managing aspects of household. Works full time 35 hours a week at [employer]. Denies SI or HI.  Denies AH or VH.  Previous medication trials: Wellbutrin, Zoloft   GAD-7    Flowsheet Row Office Visit from 02/20/2022 in [facility]  Total GAD-7 Score 4      PHQ2-9    Flowsheet Row Office Visit from 02/20/2022 in [facility]  PHQ-2 Total Score 2  PHQ-9 Total Score 6      Flowsheet Row ED to Hosp-Admission (Discharged) from 02/04/2022 in [facility] 3E HF PCU  C-SSRS RISK CATEGORY No Risk        Review of Systems:  Review of Systems  Musculoskeletal:  Negative for gait problem.  Neurological:  Negative for tremors.  Psychiatric/Behavioral:         Please refer to HPI    Medications: I have reviewed the patient's current medications.  Current Outpatient Medications  Medication Sig Dispense Refill   escitalopram (LEXAPRO) 20 MG tablet TAKE 1 TABLET BY MOUTH DAILY. (Patient  taking differently: Take 20 mg by mouth daily.) 90 tablet 3   levETIRAcetam (KEPPRA) 500 MG tablet Take 500 mg by mouth 2 (two) times daily.     lisdexamfetamine (VYVANSE) 50 MG capsule Take 1 capsule (50 mg total) by mouth daily. 30 capsule 0   lisdexamfetamine (VYVANSE) 50 MG capsule Take 1 capsule (50 mg total) by mouth daily. 30 capsule 0   [START ON 07/11/2022] lisdexamfetamine (VYVANSE) 50 MG capsule Take 1 capsule (50 mg total) by mouth daily. 30 capsule 0   pantoprazole (PROTONIX) 40 MG tablet Take 1 tablet (40 mg total) by mouth at bedtime. 14 tablet 0   sodium bicarbonate 650 MG tablet Take 1 tablet (650 mg total) by mouth 2 (two) times daily. 60 tablet 0   No current facility-administered medications for this visit.    Medication Side Effects: None  Allergies:  Allergies  Allergen Reactions   Sulfa Antibiotics Other (See Comments)    Lips turn blue and swelling    Sulfacetamide Sodium Other (See Comments)    Lips turn blue and swelling     Past Medical History:  Diagnosis Date   AKI (acute kidney injury) (HCC)    Allergy 1998   Possible sulfa allergy   Anxiety    Depression    Elevated CK 02/05/2022   GERD (gastroesophageal reflux disease) 2018   No current issues   Hyperkalemia 02/08/2022   Metabolic acidosis, normal anion gap (NAG) 02/08/2022   Rash 02/05/2022   Seizures (HCC) July 2021   Medicated.  Over 6 mo since last seizure.   Spinal puncture headache 02/11/2022    Past Medical History, Surgical history, Social history, and Family history were reviewed and updated as appropriate.   Please see review of systems for further details on the patient's review from today.   Objective:   Physical Exam:  There were no vitals taken for this visit.  Physical Exam Constitutional:      General: Wendy Vega is not in acute distress. Musculoskeletal:        General: No deformity.  Neurological:     Mental Status: Wendy Vega is alert and oriented to person,  place, and time.     Coordination: Coordination normal.  Psychiatric:        Attention and Perception: Attention and perception normal. Joanna Hews does not perceive auditory or visual hallucinations.        Mood and Affect: Mood normal. Mood is not anxious or depressed. Affect is not labile, blunt, angry or inappropriate.        Speech: Speech normal.        Behavior: Behavior normal.        Thought Content: Thought content normal. Thought content is not paranoid or delusional. Thought content does not include homicidal or suicidal ideation. Thought content does not include homicidal or suicidal plan.        Cognition and Memory: Cognition and memory normal.        Judgment: Judgment normal.     Comments: Insight intact     Lab Review:     Component Value Date/Time   NA 139 02/12/2022 0344   K 3.6 02/12/2022 0344   CL 111 02/12/2022 0344   CO2 21 (L) 02/12/2022 0344   GLUCOSE 89 02/12/2022 0344   BUN 10 02/12/2022 0344   CREATININE 1.38 (H) 02/12/2022 0344   CALCIUM 8.5 (L) 02/12/2022 0344   PROT 7.5 02/04/2022 1811   ALBUMIN 2.7 (L) 02/10/2022 0405   AST 20 02/04/2022 1811   ALT 15 02/04/2022 1811   ALKPHOS 62 02/04/2022 1811   BILITOT 0.3 02/04/2022 1811   GFRNONAA 54 (L) 02/12/2022 0344   GFRAA >60 05/11/2020 1636       Component Value Date/Time   WBC 9.6 02/12/2022 0344   RBC 4.38 02/12/2022 0344   HGB 13.4 02/12/2022 0344   HCT 37.7 02/12/2022 0344   PLT 224 02/12/2022 0344   MCV 86.1 02/12/2022 0344   MCH 30.6 02/12/2022 0344   MCHC 35.5 02/12/2022 0344   RDW 11.9 02/12/2022 0344   LYMPHSABS 1.2 02/04/2022 1811   MONOABS 0.8 02/04/2022 1811   EOSABS 0.0 02/04/2022 1811   BASOSABS 0.1 02/04/2022 1811    No results found for: "POCLITH", "LITHIUM"   No results found for: "PHENYTOIN", "PHENOBARB", "VALPROATE", "CBMZ"   .res Assessment: Plan:     Plan:  PDMP reviewed  1. Vyvanse 20mg  BID - seizure history taking Keppra 2. Lexapro 20mg   daily  98/57/57  RTC 6 months  Patient advised to contact office with any questions, adverse effects, or acute worsening in signs and symptoms.  There are no diagnoses linked to this encounter.   Please see After Visit Summary for patient specific instructions.  No future appointments.  No orders of the defined types were placed in this encounter.   -------------------------------

## 2022-06-26 ENCOUNTER — Other Ambulatory Visit (HOSPITAL_COMMUNITY): Payer: Self-pay

## 2022-06-27 ENCOUNTER — Other Ambulatory Visit (HOSPITAL_COMMUNITY): Payer: Self-pay

## 2022-06-30 ENCOUNTER — Encounter: Payer: Self-pay | Admitting: *Deleted

## 2022-07-05 ENCOUNTER — Other Ambulatory Visit (HOSPITAL_COMMUNITY): Payer: Self-pay

## 2022-07-09 ENCOUNTER — Other Ambulatory Visit (HOSPITAL_COMMUNITY): Payer: Self-pay

## 2022-07-10 ENCOUNTER — Ambulatory Visit (INDEPENDENT_AMBULATORY_CARE_PROVIDER_SITE_OTHER): Payer: Commercial Managed Care - PPO | Admitting: Family

## 2022-07-10 ENCOUNTER — Encounter: Payer: Self-pay | Admitting: Family

## 2022-07-10 ENCOUNTER — Other Ambulatory Visit (HOSPITAL_COMMUNITY): Payer: Self-pay

## 2022-07-10 VITALS — BP 113/77 | HR 55 | Temp 97.9°F | Ht 69.0 in | Wt 195.2 lb

## 2022-07-10 DIAGNOSIS — Z87448 Personal history of other diseases of urinary system: Secondary | ICD-10-CM

## 2022-07-10 DIAGNOSIS — Z23 Encounter for immunization: Secondary | ICD-10-CM | POA: Diagnosis not present

## 2022-07-10 NOTE — Progress Notes (Signed)
Patient ID: ATHEENA SPANO, adult    DOB: 05/15/1996, 26 y.o.   MRN: [id number]  Chief Complaint  Patient presents with   Follow-up    HPI: History of meningitis/AKI:  pt hospitalized from 5/2-5/10.  Seen by nephrology 5/24, no records in chart.  She was told by Nephro that her kidney function had improved but she should get checked again in a few months but they no longer needed to see her.  Assessment & Plan:   Problem List Items Addressed This Visit   None Visit Diagnoses     History of renal impairment    -  Primary checking BMP today, pt denies any sx, hydrating well, feeling overall much better.    Relevant Orders   Basic Metabolic Panel (BMET)   Need for immunization against influenza       Relevant Orders   Flu Vaccine QUAD 6+ mos PF IM (Fluarix Quad PF)      Subjective:    Outpatient Medications Prior to Visit  Medication Sig Dispense Refill   escitalopram (LEXAPRO) 20 MG tablet TAKE 1 TABLET BY MOUTH DAILY. 90 tablet 3   levETIRAcetam (KEPPRA) 500 MG tablet Take 500 mg by mouth 2 (two) times daily.     lisdexamfetamine (VYVANSE) 20 MG capsule Take one capsule by mouth twice daily. 60 capsule 0   pantoprazole (PROTONIX) 40 MG tablet Take 1 tablet (40 mg total) by mouth at bedtime. 14 tablet 0   sodium bicarbonate 650 MG tablet Take 1 tablet (650 mg total) by mouth 2 (two) times daily. 60 tablet 0   No facility-administered medications prior to visit.   Past Medical History:  Diagnosis Date   AKI (acute kidney injury) ([place])    Allergy 1998   Possible sulfa allergy   Anxiety    Depression    Elevated CK 02/05/2022   GERD (gastroesophageal reflux disease) 2018   No current issues   Hyperkalemia 02/09/4331   Metabolic acidosis, normal anion gap (NAG) 02/08/2022   Rash 02/05/2022   Seizures ([place]) July 2021   Medicated. Over 6 mo since last seizure.   Spinal puncture headache 02/11/2022   Past Surgical History:  Procedure Laterality Date   IR FLUORO GUIDED NEEDLE  PLC ASPIRATION/INJECTION LOC  02/10/2022   left arm surgery      WISDOM TOOTH EXTRACTION     Allergies  Allergen Reactions   Sulfa Antibiotics Other (See Comments)    Lips turn blue and swelling    Sulfacetamide Sodium Other (See Comments)    Lips turn blue and swelling       Objective:    Physical Exam Vitals and nursing note reviewed.  Constitutional:      Appearance: Normal appearance.  Cardiovascular:     Rate and Rhythm: Normal rate and regular rhythm.  Pulmonary:     Effort: Pulmonary effort is normal.     Breath sounds: Normal breath sounds.  Musculoskeletal:        General: Normal range of motion.  Skin:    General: Skin is warm and dry.  Neurological:     Mental Status: ALBERTO SCHOCH is alert.  Psychiatric:        Mood and Affect: Mood normal.        Behavior: Behavior normal.    BP 113/77 (BP Location: Left Arm, Patient Position: Sitting, Cuff Size: Large)   Pulse (!) 55   Temp 97.9 F (36.6 C) (Temporal)   Ht 5\' 9"  (1.753 m)  Wt 195 lb 4 oz (88.6 kg)   LMP 06/25/2022 (Approximate)   SpO2 97%   BMI 28.83 kg/m  Wt Readings from Last 3 Encounters:  07/10/22 195 lb 4 oz (88.6 kg)  02/20/22 190 lb 6 oz (86.4 kg)  02/12/22 199 lb 3.2 oz (90.4 kg)      Dulce Sellar, NP

## 2022-07-11 LAB — BASIC METABOLIC PANEL
BUN: 10 mg/dL (ref 6–23)
CO2: 27 mEq/L (ref 19–32)
Calcium: 9.5 mg/dL (ref 8.4–10.5)
Chloride: 103 mEq/L (ref 96–112)
Creatinine, Ser: 0.64 mg/dL (ref 0.40–1.20)
GFR: 122.78 mL/min (ref >60.00)
Glucose, Bld: 89 mg/dL (ref 70–99)
Potassium: 4.1 mEq/L (ref 3.5–5.1)
Sodium: 137 mEq/L (ref 135–145)

## 2022-07-11 NOTE — Progress Notes (Signed)
Kidney function is all back to normal!   Take care :-)

## 2022-07-14 ENCOUNTER — Other Ambulatory Visit: Payer: Self-pay

## 2022-09-08 IMAGING — CT CT HEAD W/O CM
4 series · 17 of 47 positions shown, 19 images · non-contrast
Comparison: Head CT 02/04/2022, [DATE] days ago

CLINICAL DATA: Sudden onset of severe headache.



[Series 3: head without · axial · non-contrast · 0.44mm/px · z∈[-253,-133]mm · 7 of 33 slices shown, 9 images]
[im 5/33  brain]
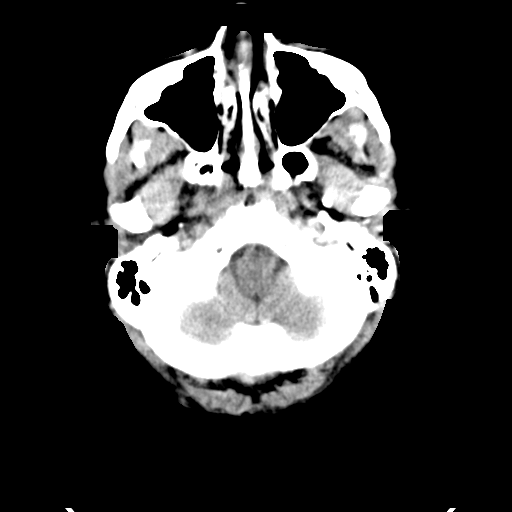
[im 5/33  bone]
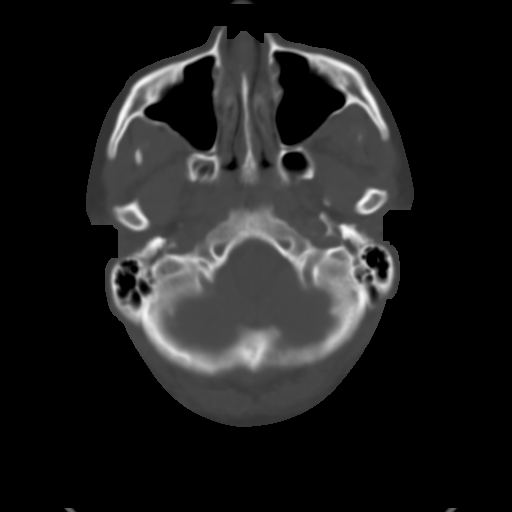
[im 9/33  brain]
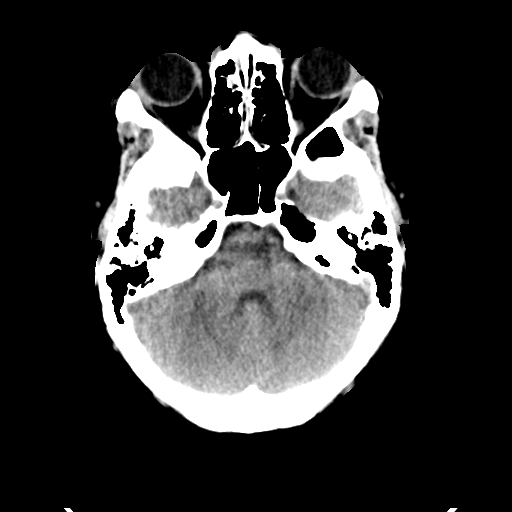
[im 13/33  brain]
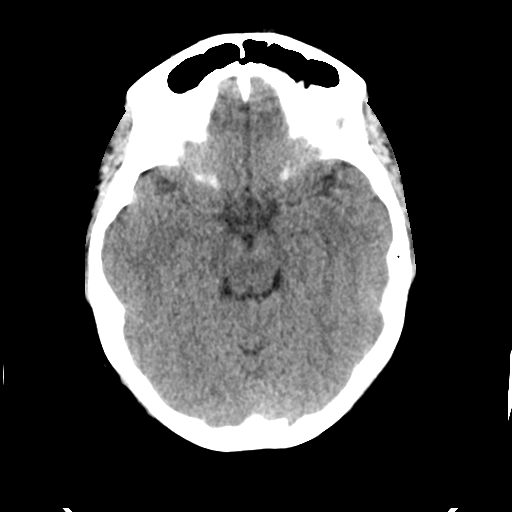
[im 17/33  brain]
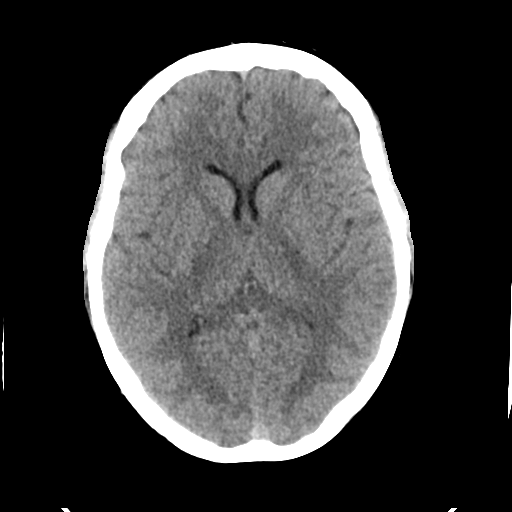
[im 21/33  brain]
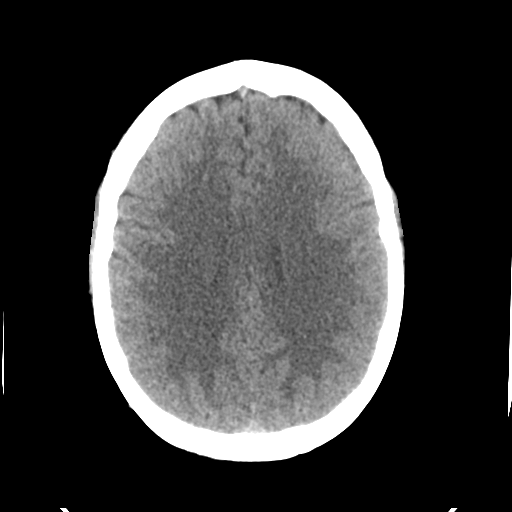
[im 21/33  bone]
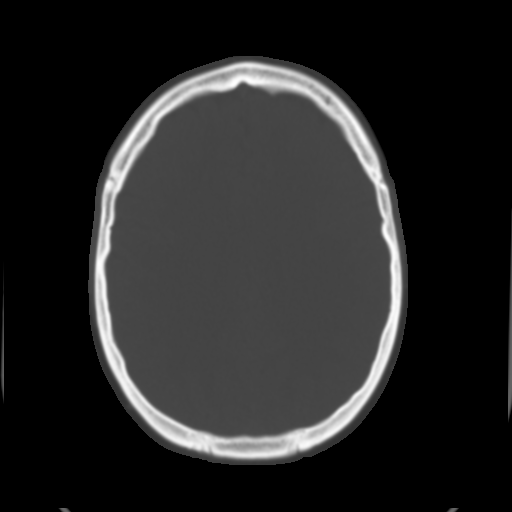
[im 25/33  brain]
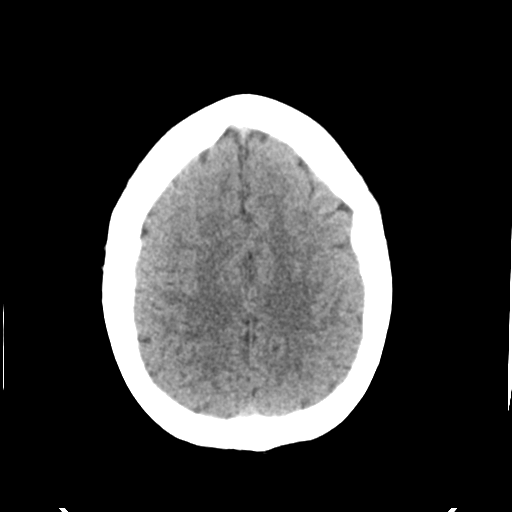
[im 29/33  brain]
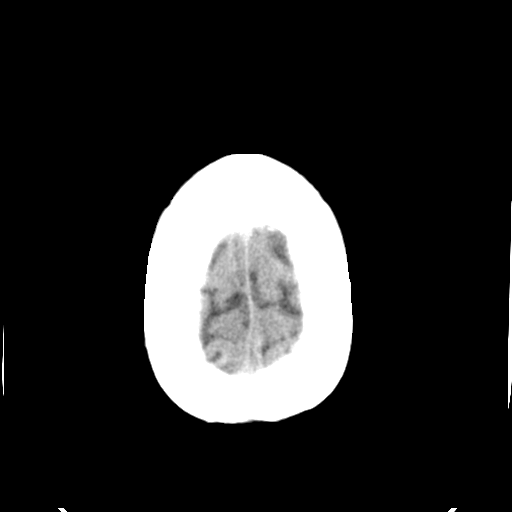

[Series 4: head bone · axial · 0.44mm/px · z∈[-257,-201]mm · 4 of 83 slices shown]
[im 9/83  bone]
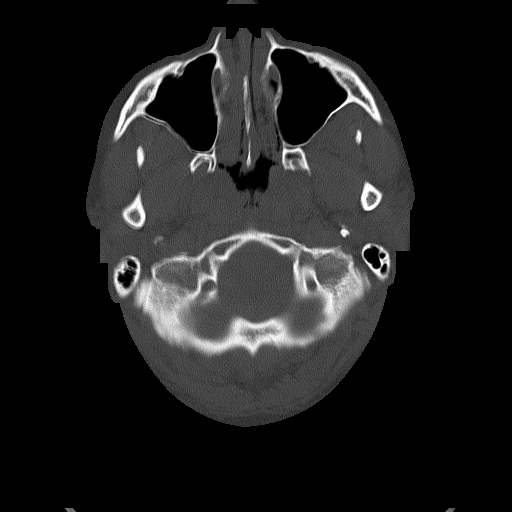
[im 17/83  bone]
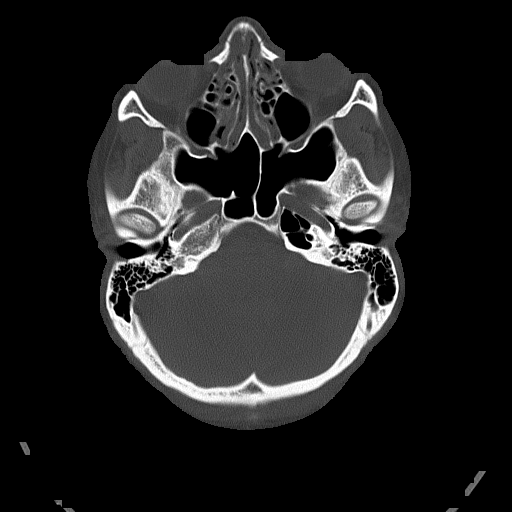
[im 25/83  bone]
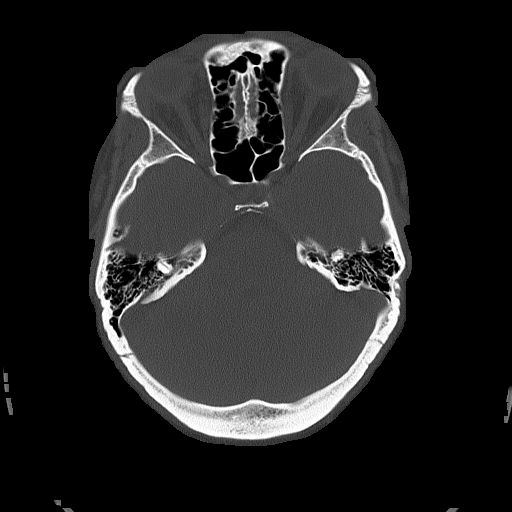
[im 37/83  bone]
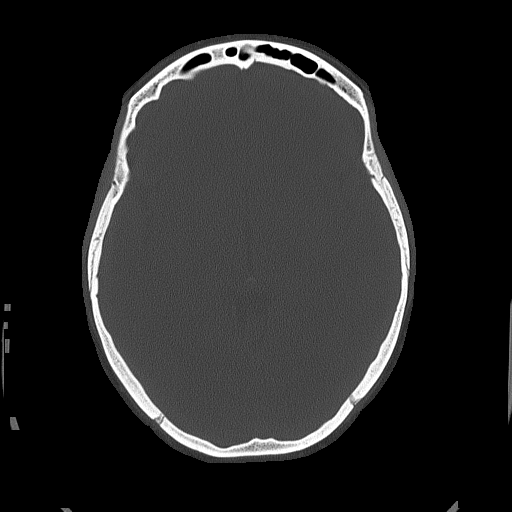

[Series 5: head without cor · coronal · non-contrast · 0.37mm/px · 3 of 70 slices shown]
[im 24/70  brain]
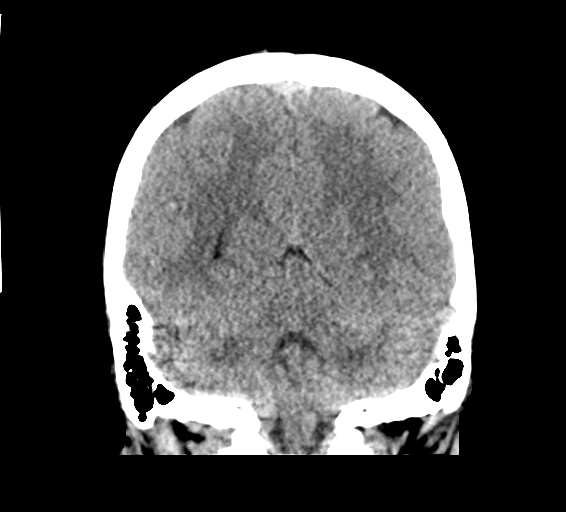
[im 31/70  brain]
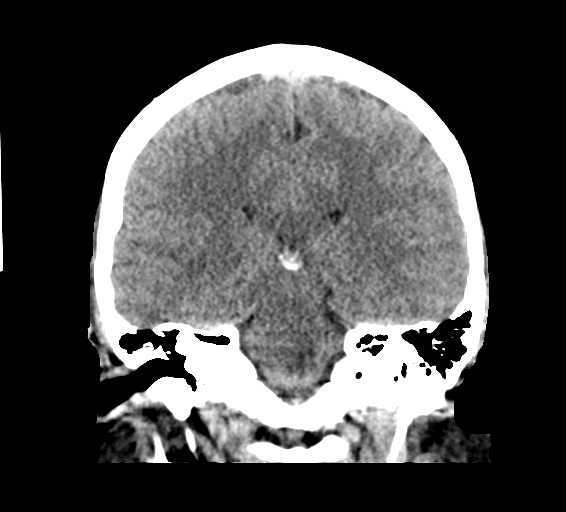
[im 39/70  brain]
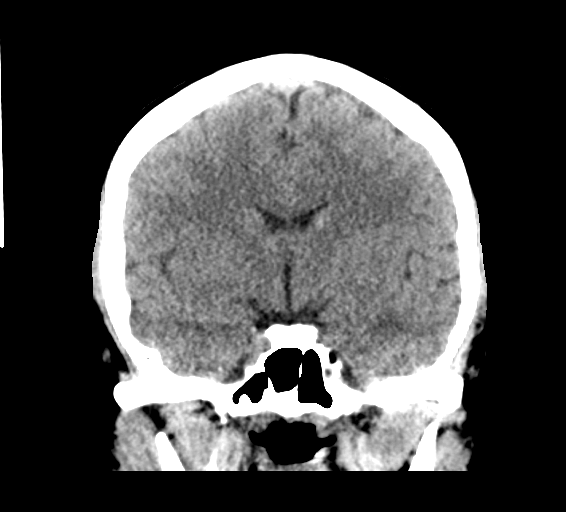

[Series 6: head without sag · sagittal · non-contrast · 0.39mm/px · 3 of 59 slices shown]
[im 20/59  brain]
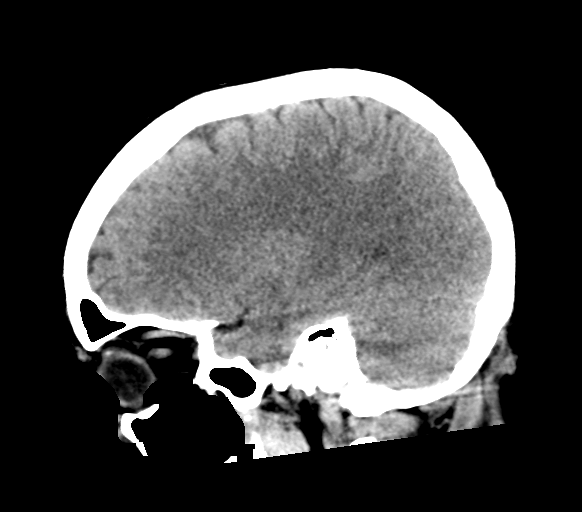
[im 30/59  brain]
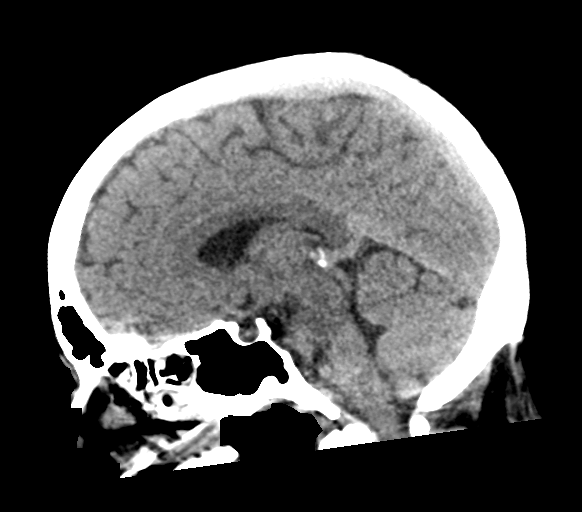
[im 39/59  brain]
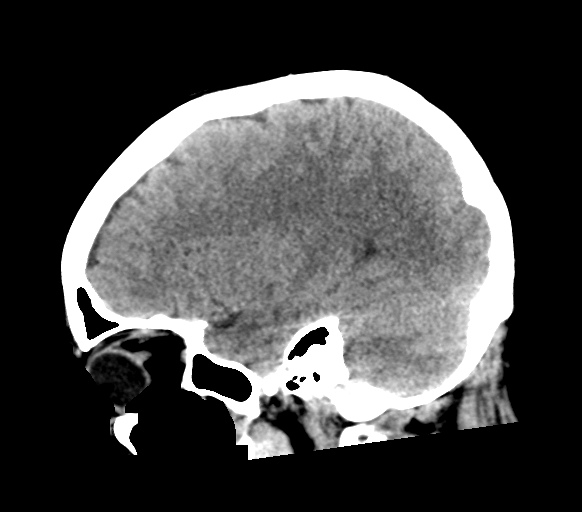

[17 of 47 positions shown; findings below may reference images not displayed]

FINDINGS: Brain: No intracranial hemorrhage, mass effect, or midline shift. No
hydrocephalus. The basilar cisterns are patent. No evidence of
territorial infarct or acute ischemia. No extra-axial or
intracranial fluid collection.

Vascular: No hyperdense vessel or unexpected calcification.

Skull: No fracture or focal lesion.

Sinuses/Orbits: Mucosal thickening throughout the ethmoid air cells.
No sinus fluid levels. No mastoid effusion or opacification.

Other: No subcutaneous scalp abnormality.
IMPRESSION: 1. No acute intracranial abnormality.
2. Ethmoid air cell mucosal thickening.

## 2022-09-10 IMAGING — XA IR FLUORO GUIDE NDL PLMT / BX
2 series · 4 of 4 positions shown · IV contrast (isovue)
Comparison: None Available.

CLINICAL DATA: Persistent spinal headache 6 days post bedside
lumbar puncture

EXAM:
LUMBAR EPIDUROGRAM AND BLOOD PATCH
FLUOROSCOPY:
Radiation Exposure Index (as provided by the fluoroscopic device): 9
MGy air Kerma
TECHNIQUE: The skin entry site from previous lumbar puncture was localized
corresponding to the space between the L3 and L4 spinous processes.
A relatively diminutive L5 spinous process is noted. An appropriate
skin entry site was determined. Operator donned sterile gloves and
mask. Skin site was marked, prepped with Betadine, and draped in
usual sterile fashion, and infiltrated locally with 1% lidocaine.
The 20 gauge Crawford needle was advanced into the lumbar posterior
epidural space near the midline using a left interlaminar approach
at L3-NUusing loss of resistance technique. Diagnostic injection of
2ml Isovue-M 200 demonstrates good epidural spread above and below
the needle tip and crossing the midline. No intravascular or
subarachnoid component. 20 ml of autologous blood were then
administered as lumbar epidural blood patch. No immediate
complication.

[Series 1: fl neuro n · 2 of 2 slices shown (1 of 2)]
[im 1/2]
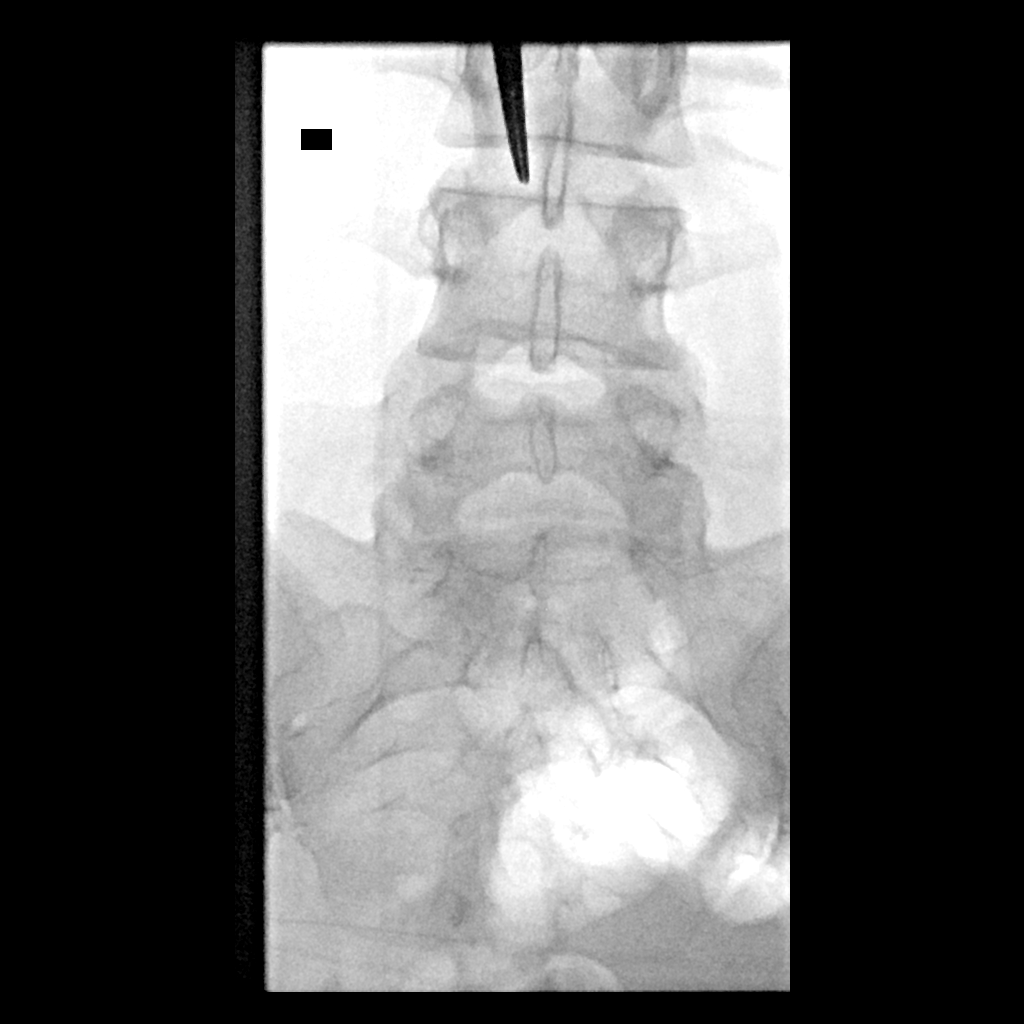
[im 2/2]
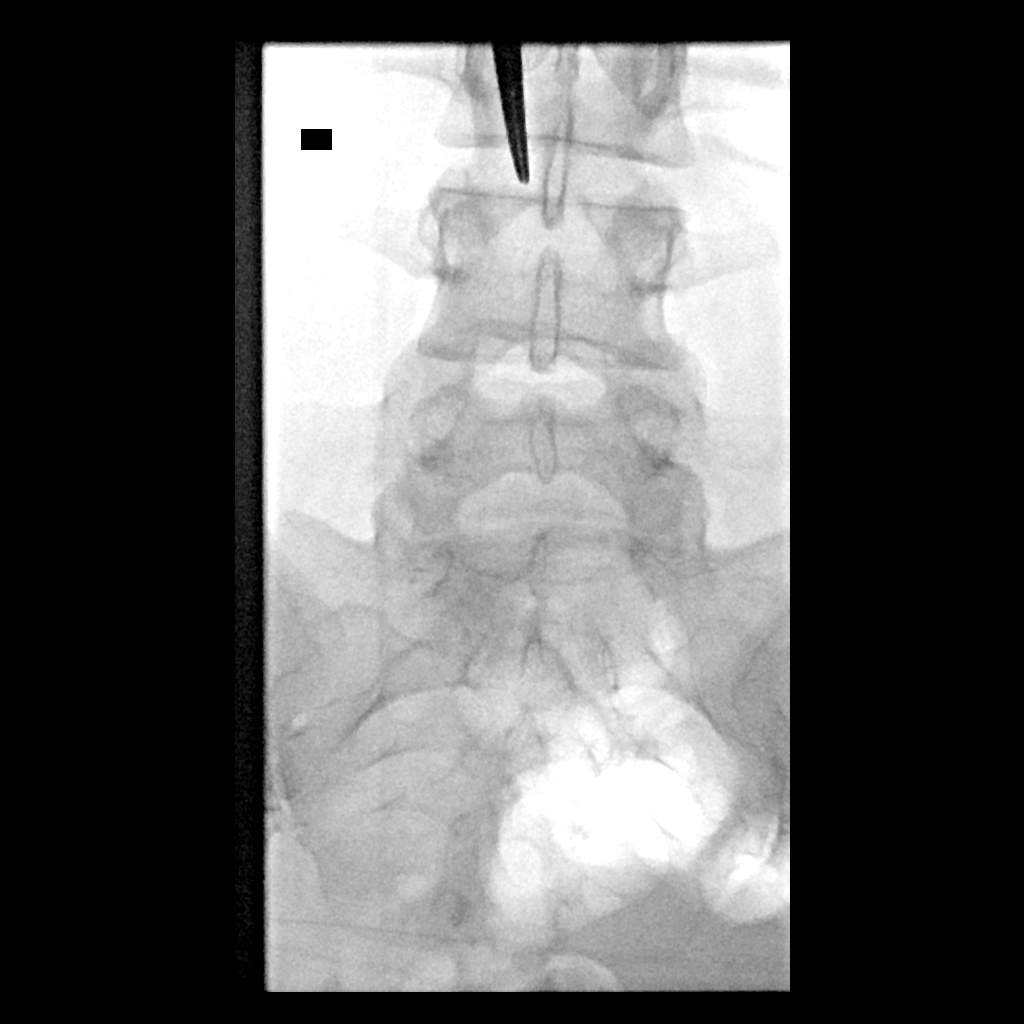

[Series 2: fl neuro n · 2 of 2 slices shown (2 of 2)]
[im 1/2]
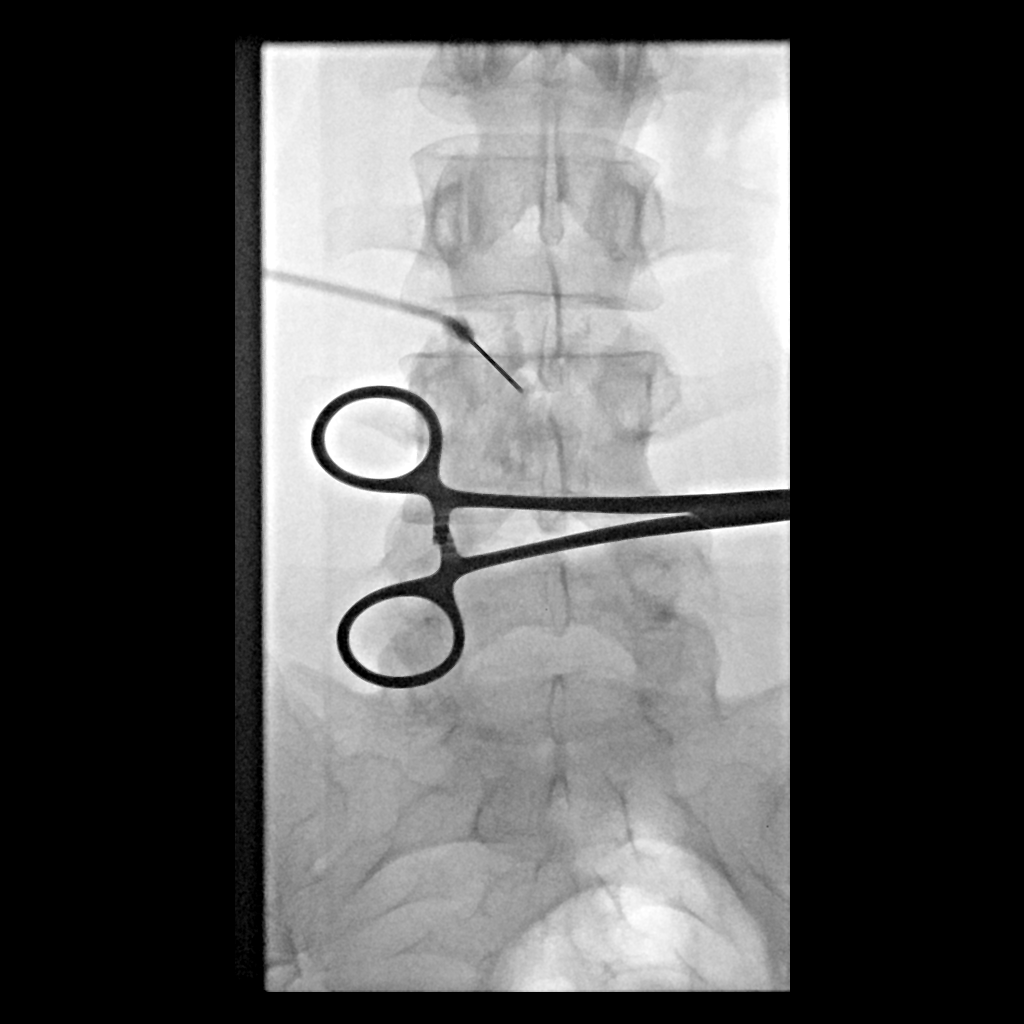
[im 2/2]
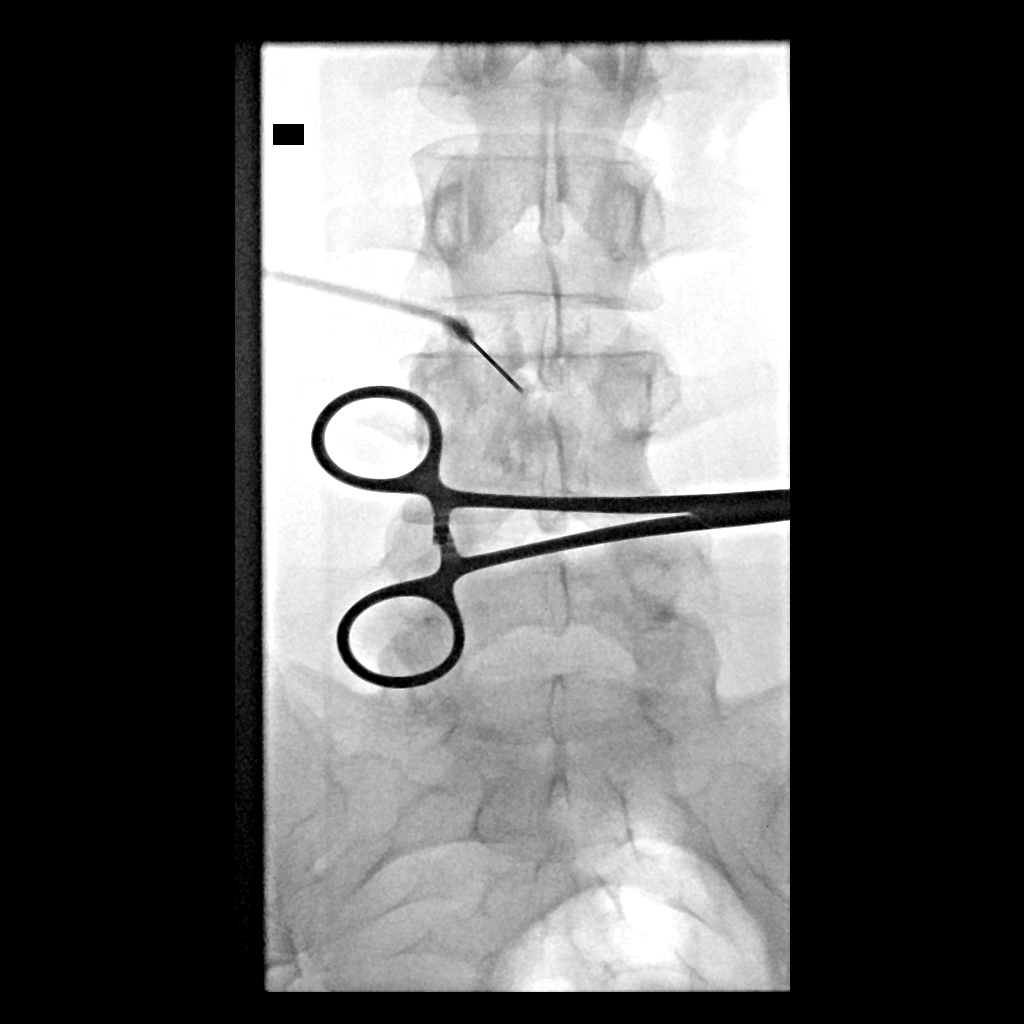

[4 of 4 positions shown; findings below may reference images not displayed]

IMPRESSION: 1. Technically successful lumbar epidural blood patch under
fluoroscopy. The patient was counseled on the importance of laying
flat for an additional 24 hours and maintaining good p.o. fluid
intake.

## 2022-09-11 ENCOUNTER — Other Ambulatory Visit: Payer: Self-pay

## 2022-09-11 ENCOUNTER — Other Ambulatory Visit (HOSPITAL_COMMUNITY): Payer: Self-pay

## 2022-09-11 ENCOUNTER — Telehealth: Payer: Self-pay | Admitting: Adult Health

## 2022-09-11 DIAGNOSIS — F909 Attention-deficit hyperactivity disorder, unspecified type: Secondary | ICD-10-CM

## 2022-09-11 MED ORDER — LISDEXAMFETAMINE DIMESYLATE 20 MG PO CAPS
ORAL_CAPSULE | ORAL | 0 refills | Status: DC
  Filled 2022-09-11: qty 60, 30d supply, fill #0

## 2022-09-11 NOTE — Telephone Encounter (Signed)
Pended.

## 2022-09-11 NOTE — Telephone Encounter (Signed)
Patient lvm at 3:51 requesting refill for Vyvanse 20mg . States she is going out of town Sunday and needs prescription sent over. Ph: [phone] Appt 3/1 Pharmacy [facility] [address] [place]

## 2022-09-12 ENCOUNTER — Other Ambulatory Visit (HOSPITAL_COMMUNITY): Payer: Self-pay

## 2022-11-18 ENCOUNTER — Telehealth: Payer: Self-pay | Admitting: Adult Health

## 2022-11-18 ENCOUNTER — Other Ambulatory Visit: Payer: Self-pay

## 2022-11-18 DIAGNOSIS — F909 Attention-deficit hyperactivity disorder, unspecified type: Secondary | ICD-10-CM

## 2022-11-18 NOTE — Telephone Encounter (Signed)
Pended.

## 2022-11-18 NOTE — Telephone Encounter (Signed)
Pt LVM@ 4:20p. She would like refill of Vyvanse sent to [facility] due to insurance change.  Next appt 3/21

## 2022-11-19 MED ORDER — LISDEXAMFETAMINE DIMESYLATE 20 MG PO CAPS: ORAL_CAPSULE | ORAL | 0 refills | Status: AC

## 2022-12-16 ENCOUNTER — Telehealth: Payer: Self-pay

## 2022-12-16 NOTE — Telephone Encounter (Signed)
Prior Authorization Brand Vyvanse #[id number] [employer]  Medicaid   Approved Effective:  Coverage Starts on: 12/16/2022 12:00:00 AM, Coverage Ends on: 12/16/2023 12:00:00 AM.

## 2022-12-25 ENCOUNTER — Ambulatory Visit: Payer: Commercial Managed Care - PPO | Admitting: Adult Health

## 2023-10-23 ENCOUNTER — Encounter: Payer: Self-pay | Admitting: Neurology

## 2023-12-02 ENCOUNTER — Ambulatory Visit (INDEPENDENT_AMBULATORY_CARE_PROVIDER_SITE_OTHER): Payer: Commercial Managed Care - PPO | Admitting: Neurology

## 2023-12-02 ENCOUNTER — Encounter: Payer: Self-pay | Admitting: Neurology

## 2023-12-02 ENCOUNTER — Other Ambulatory Visit (HOSPITAL_COMMUNITY): Payer: Self-pay

## 2023-12-02 VITALS — BP 121/77 | HR 80 | Ht 69.0 in | Wt 213.4 lb

## 2023-12-02 DIAGNOSIS — G40909 Epilepsy, unspecified, not intractable, without status epilepticus: Secondary | ICD-10-CM | POA: Diagnosis not present

## 2023-12-02 MED ORDER — LEVETIRACETAM 1000 MG PO TABS
1000.0000 mg | ORAL_TABLET | Freq: Two times a day (BID) | ORAL | 3 refills | Status: DC
  Filled 2023-12-02 – 2023-12-16 (×2): qty 180, 90d supply, fill #0

## 2023-12-02 NOTE — Progress Notes (Signed)
 NEUROLOGY CONSULTATION NOTE  Wendy Vega MRN: [id number] DOB: November 16, 1995  Referring provider: Dr. Maryelizabeth Rowan Primary care provider: Dulce Sellar, NP  Reason for consult:  uncontrolled seizures  Dear Dr Duanne Guess:  Thank you for your kind referral of Wendy Vega for consultation of the above symptoms. Although her history is well known to you, please allow me to reiterate it for the purpose of our medical record. She is alone in the office today. Records and images were personally reviewed where available.   HISTORY OF PRESENT ILLNESS: This is a 28 year old right-handed woman with a history of ADD, MDD, GAD, presenting to establish care for epilepsy. She was previously seen by neurologist Dr. Tyler Deis at [facility] 6 months ago, records unavailable for review. She was diagnosed with epilepsy in 2021. She was at a music festival and asleep in a tent then woke up in the hospital. Her friends heard her groaning and witnessed a convulsion lasting 2-3 minutes. She bit her lip, no focal weakness. The next seizure occurred 6 months after, it was the morning after another concert and they were at a hotel. She was brushing her teeth, no warning symptoms, then woke up in bed with EMS around her. Her friend heard her fall and saw her convulsing. She was started on Levetiracetam at that time. She reports having 4-5 seizures in total, last seizure was 11/19/22, it was another nocturnal seizure. Levetiracetam dose increased to 1000mg  BID at that time. She recalls having flashes of deja vu with epigastric sensation ("stomach drop roller coaster feeling") when she was in high school, she recalls having an EEG. EEG done 01/2013 was normal. She denies any staring/unresponsive episodes, gaps in time, olfactory/gustatory hallucinations, focal numbness/tingling/weakness, myoclonic jerks. She used to have panic attacks. Memory has been "iffy" since the seizures started. She denies missing  medications, bill payments, she denies getting lost driving.   She had aseptic meningitis in 2023. She denies any headaches. Every once in a while, she feels a "whooshy" feeling like a small wave that goes away quickly. She denies any diplopia, dysarthria/dysphagia, neck/back pain, bowel/bladder dysfunction. She notes that energy and libido are "non-existent," she has been taking Lexapro for 2-3 years. Mood is stable/stable but she feels "kind of tired, a little bit separated." No pregnancy plans, she has the Mirena IUD. She lives alone. She works as a Naval architect.   Diagnostic Data available for review: EEGs: EEG in 2014 normal MRI: MRI brain without contrast at [facility] done 09/2021 normal (images unavailable for review)   PAST MEDICAL HISTORY: Past Medical History:  Diagnosis Date   AKI (acute kidney injury) (HCC)    Allergy 1998   Possible sulfa allergy   Anxiety    Depression    Elevated CK 02/05/2022   GERD (gastroesophageal reflux disease) 2018   No current issues   Hyperkalemia 02/08/2022   Metabolic acidosis, normal anion gap (NAG) 02/08/2022   Rash 02/05/2022   Seizures (HCC) July 2021   Medicated. Over 6 mo since last seizure.   Spinal puncture headache 02/11/2022    PAST SURGICAL HISTORY: Past Surgical History:  Procedure Laterality Date   IR FLUORO GUIDED NEEDLE PLC ASPIRATION/INJECTION LOC  02/10/2022   left arm surgery      WISDOM TOOTH EXTRACTION      MEDICATIONS: Current Outpatient Medications on File Prior to Visit  Medication Sig Dispense Refill   amphetamine-dextroamphetamine (ADDERALL) 15 MG tablet Take 15 mg by mouth 2 (two)  times daily.     escitalopram (LEXAPRO) 20 MG tablet TAKE 1 TABLET BY MOUTH DAILY. 90 tablet 3   levETIRAcetam (KEPPRA) 500 MG tablet Take 500 mg by mouth 2 (two) times daily.     lisdexamfetamine (VYVANSE) 20 MG capsule Take one capsule by mouth twice daily. (Patient not taking: Reported on 12/02/2023) 60 capsule 0   No current  facility-administered medications on file prior to visit.    ALLERGIES: Allergies  Allergen Reactions   Sulfa Antibiotics Other (See Comments)    Lips turn blue and swelling    Sulfacetamide Sodium Other (See Comments)    Lips turn blue and swelling     FAMILY HISTORY: Family History  Problem Relation Age of Onset   Cancer Other    Hyperlipidemia Other     SOCIAL HISTORY: Social History   Socioeconomic History   Marital status: Single    Spouse name: Not on file   Number of children: Not on file   Years of education: Not on file   Highest education level: Not on file  Occupational History   Not on file  Tobacco Use   Smoking status: Never   Smokeless tobacco: Never   Tobacco comments:    Smoked 2 packs of cig daily for 2 years. Quit cig in 2018 and got a vape. Ready to quit.  Vaping Use   Vaping status: Every Day  Substance and Sexual Activity   Alcohol use: Yes    Alcohol/week: 2.0 standard drinks of alcohol    Types: 1 Glasses of wine, 1 Cans of beer per week    Comment: occ   Drug use: Yes    Frequency: 7.0 times per week    Types: Marijuana   Sexual activity: Yes    Birth control/protection: Other-see comments, None    Comment: Monogamous with female partner. No risk of pregnancy.  Other Topics Concern   Not on file  Social History Narrative   Are you right handed or left handed? Right    Are you currently employed ? 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 alone   Who lives with you? NA   What type of home do you live in: 1 story or 2 story?  2nd story apartment         Social Drivers of Corporate investment banker Strain: Not on file  Food Insecurity: Not on file  Transportation Needs: Not on file  Physical Activity: Not on file  Stress: Not on file  Social Connections: Not on file  Intimate Partner Violence: Not on file     PHYSICAL EXAM: Vitals:   12/02/23 0843  BP: 121/77  Pulse: 80   SpO2: 99%   General: No acute distress Head:  Normocephalic/atraumatic Skin/Extremities: No rash, no edema Neurological Exam: Mental status: alert and oriented to person, place, and time, no dysarthria or aphasia, Fund of knowledge is appropriate.  Recent and remote memory are intact, 3/3 delayed recall.  Attention and concentration are normal, 5/5 WORLD backwards.  Cranial nerves: CN I: not tested CN II: pupils equal, round, visual fields intact CN III, IV, VI:  full range of motion, no nystagmus, no ptosis CN V: facial sensation intact CN VII: upper and lower face symmetric CN VIII: hearing intact to conversation Bulk & Tone: normal, no fasciculations. Motor: 5/5 throughout with no pronator drift. Sensation: intact to light touch, cold, pin, vibration sense.  No  extinction to double simultaneous stimulation.  Romberg test negative Deep Tendon Reflexes: +1 both UE, +2 both LE Cerebellar: no incoordination on finger to nose testing Gait: narrow-based and steady, able to tandem walk adequately. Tremor: none   IMPRESSION: This is a 28 year old right-handed woman with a history of ADD, MDD, GAD, presenting to establish care for epilepsy, etiology unclear. 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 She denies any seizures since Levetiracetam increased to 1000mg  BID (last seizure 11/19/2022. Her last EEG was in 2014, repeat 1-hour EEG will be ordered. She will try vitamin B6 100mg  which can help with mood changes associated with Levetiracetam. Records from her prior neurologist will be requested for review. [place] driving laws were discussed with the patient, and she knows to stop driving after a seizure, until 6 months seizure-free. Follow-up in 4 months, call for any changes.   Thank you for allowing me to participate in the care of this patient. Please do not hesitate to call for any questions or concerns.   Patrcia Dolly, M.D.  CC:  Dr. Duanne Guess, Dulce Sellar, NP

## 2023-12-02 NOTE — Patient Instructions (Signed)
 Good to meet you!  Schedule 1-hour EEG  2. Continue Keppra (Levetiracetam) 1000mg  twice a day  3. Try taking a daily B6 vitamin 100mg  tablet and see if this helps with mood changes associated with Keppra  4. Records from your prior neurologist will be requested for review  5. Follow-up in 4 months, call for any changes   Seizure Precautions: 1. If medication has been prescribed for you to prevent seizures, take it exactly as directed.  Do not stop taking the medicine without talking to your doctor first, even if you have not had a seizure in a long time.   2. Avoid activities in which a seizure would cause danger to yourself or to others.  Don't operate dangerous machinery, swim alone, or climb in high or dangerous places, such as on ladders, roofs, or girders.  Do not drive unless your doctor says you may.  3. If you have any warning that you may have a seizure, lay down in a safe place where you can't hurt yourself.    4.  No driving for 6 months from last seizure, as per [facility].   Please refer to the following link on the Epilepsy Foundation of America's website for more information: http://www.epilepsyfoundation.org/answerplace/Social/driving/drivingu.cfm   5.  Maintain good sleep hygiene. Avoid alcohol.  6.  Notify your neurology if you are planning pregnancy or if you become pregnant.  7.  Contact your doctor if you have any problems that may be related to the medicine you are taking.  8.  Call 911 and bring the patient back to the ED if:        A.  The seizure lasts longer than 5 minutes.       B.  The patient doesn't awaken shortly after the seizure  C.  The patient has new problems such as difficulty seeing, speaking or moving  D.  The patient was injured during the seizure  E.  The patient has a temperature over 102 F (39C)  F.  The patient vomited and now is having trouble breathing

## 2023-12-03 ENCOUNTER — Ambulatory Visit (INDEPENDENT_AMBULATORY_CARE_PROVIDER_SITE_OTHER): Payer: Commercial Managed Care - PPO | Admitting: Neurology

## 2023-12-03 DIAGNOSIS — G40909 Epilepsy, unspecified, not intractable, without status epilepticus: Secondary | ICD-10-CM | POA: Diagnosis not present

## 2023-12-03 NOTE — Progress Notes (Signed)
 EEG complete - results pending

## 2023-12-06 NOTE — Procedures (Signed)
 ELECTROENCEPHALOGRAM REPORT  Date of Study: 12/03/2023  Patient's Name: Wendy Vega MRN: [id number] Date of Birth: [birth year]  Referring Provider: Dr. Patrcia Dolly  Clinical History: This is a 28 year old woman with nocturnal seizures, history of episodes of deja vu and epigastric sensation. EEG for classification.  CNS Active Medications: Keppra, Lexapro, Adderall  Technical Summary: A multichannel digital EEG recording measured by the international 10-20 system with electrodes applied with paste and impedances below 5000 ohms performed in our laboratory with EKG monitoring in an awake and asleep patient.  Hyperventilation and photic stimulation were performed.  The digital EEG was referentially recorded, reformatted, and digitally filtered in a variety of bipolar and referential montages for optimal display.    Description: The patient is awake and asleep during the recording.  During maximal wakefulness, there is a symmetric, medium voltage 10 Hz posterior dominant rhythm that attenuates with eye opening.  The record is symmetric.  During drowsiness and sleep, there is an increase in theta slowing of the background.  Vertex waves and symmetric sleep spindles were seen. Hyperventilation and photic stimulation did not elicit any abnormalities.  There were no epileptiform discharges or electrographic seizures seen.    EKG lead was unremarkable.  Impression: This awake and asleep EEG is normal.    Clinical Correlation: A normal EEG does not exclude a clinical diagnosis of epilepsy.  If further clinical questions remain, prolonged EEG may be helpful.  Clinical correlation is advised.   Patrcia Dolly, M.D.

## 2023-12-14 ENCOUNTER — Other Ambulatory Visit (HOSPITAL_COMMUNITY): Payer: Self-pay

## 2023-12-16 ENCOUNTER — Other Ambulatory Visit (HOSPITAL_COMMUNITY): Payer: Self-pay

## 2023-12-21 ENCOUNTER — Other Ambulatory Visit (HOSPITAL_COMMUNITY): Payer: Self-pay

## 2023-12-21 MED ORDER — ESCITALOPRAM OXALATE 20 MG PO TABS
20.0000 mg | ORAL_TABLET | Freq: Every day | ORAL | 3 refills | Status: AC
  Filled 2023-12-21: qty 30, 30d supply, fill #0

## 2023-12-21 MED ORDER — LISDEXAMFETAMINE DIMESYLATE 20 MG PO CAPS
20.0000 mg | ORAL_CAPSULE | Freq: Every morning | ORAL | 0 refills | Status: DC
  Filled 2023-12-21: qty 45, 30d supply, fill #0

## 2023-12-29 ENCOUNTER — Other Ambulatory Visit (HOSPITAL_COMMUNITY): Payer: Self-pay

## 2024-01-26 ENCOUNTER — Other Ambulatory Visit (HOSPITAL_COMMUNITY): Payer: Self-pay

## 2024-01-28 ENCOUNTER — Other Ambulatory Visit (HOSPITAL_COMMUNITY): Payer: Self-pay

## 2024-01-28 MED ORDER — LISDEXAMFETAMINE DIMESYLATE 20 MG PO CAPS
20.0000 mg | ORAL_CAPSULE | Freq: Every morning | ORAL | 0 refills | Status: AC
  Filled 2024-01-28: qty 45, 30d supply, fill #0

## 2024-02-05 ENCOUNTER — Other Ambulatory Visit (HOSPITAL_COMMUNITY): Payer: Self-pay

## 2024-02-05 MED ORDER — LISDEXAMFETAMINE DIMESYLATE 20 MG PO CAPS: 20.0000 mg | ORAL_CAPSULE | Freq: Two times a day (BID) | ORAL | 0 refills | Status: AC

## 2024-02-05 MED ORDER — LISDEXAMFETAMINE DIMESYLATE 20 MG PO CAPS
20.0000 mg | ORAL_CAPSULE | Freq: Two times a day (BID) | ORAL | 0 refills | Status: AC
Start: 2024-02-18 — End: ?
  Filled 2024-03-18: qty 60, 30d supply, fill #0

## 2024-02-05 MED ORDER — ESCITALOPRAM OXALATE 20 MG PO TABS
20.0000 mg | ORAL_TABLET | Freq: Every day | ORAL | 3 refills | Status: DC
  Filled 2024-02-05 – 2024-03-18 (×2): qty 90, 90d supply, fill #0
  Filled 2024-07-05: qty 90, 90d supply, fill #1

## 2024-02-15 ENCOUNTER — Other Ambulatory Visit (HOSPITAL_COMMUNITY): Payer: Self-pay

## 2024-03-18 ENCOUNTER — Other Ambulatory Visit (HOSPITAL_COMMUNITY): Payer: Self-pay

## 2024-03-21 ENCOUNTER — Other Ambulatory Visit (HOSPITAL_COMMUNITY): Payer: Self-pay

## 2024-03-21 ENCOUNTER — Telehealth: Payer: Self-pay | Admitting: Neurology

## 2024-03-21 MED ORDER — LEVETIRACETAM 1000 MG PO TABS
1000.0000 mg | ORAL_TABLET | Freq: Two times a day (BID) | ORAL | 0 refills | Status: DC
Start: 2024-03-21 — End: 2024-04-29
  Filled 2024-03-21: qty 180, 90d supply, fill #0

## 2024-03-21 NOTE — Telephone Encounter (Signed)
 1. Which medications need refilled? (List name and dosage, if known) keppra   2. Which pharmacy/location is medication to be sent to? (include street and city if Insurance claims handler) community pharmacy at [facility]

## 2024-03-21 NOTE — Telephone Encounter (Signed)
Refill sent in for pt,  

## 2024-03-30 ENCOUNTER — Other Ambulatory Visit (HOSPITAL_COMMUNITY): Payer: Self-pay

## 2024-03-30 MED ORDER — DOXYCYCLINE HYCLATE 100 MG PO TABS
ORAL_TABLET | ORAL | 0 refills | Status: DC
  Filled 2024-03-30: qty 14, 7d supply, fill #0

## 2024-04-12 ENCOUNTER — Other Ambulatory Visit (HOSPITAL_COMMUNITY): Payer: Self-pay

## 2024-04-18 ENCOUNTER — Other Ambulatory Visit (HOSPITAL_COMMUNITY): Payer: Self-pay

## 2024-04-18 MED ORDER — LISDEXAMFETAMINE DIMESYLATE 20 MG PO CAPS: 20.0000 mg | ORAL_CAPSULE | Freq: Two times a day (BID) | ORAL | 0 refills | Status: AC

## 2024-04-18 MED ORDER — LISDEXAMFETAMINE DIMESYLATE 20 MG PO CAPS
20.0000 mg | ORAL_CAPSULE | Freq: Two times a day (BID) | ORAL | 0 refills | Status: DC
  Filled 2024-07-05 – 2024-07-20 (×3): qty 60, 30d supply, fill #0

## 2024-04-18 MED ORDER — LISDEXAMFETAMINE DIMESYLATE 20 MG PO CAPS
20.0000 mg | ORAL_CAPSULE | Freq: Two times a day (BID) | ORAL | 0 refills | Status: AC
Start: 2024-04-18 — End: ?
  Filled 2024-04-18 – 2024-04-20 (×2): qty 60, 30d supply, fill #0

## 2024-04-20 ENCOUNTER — Other Ambulatory Visit (HOSPITAL_COMMUNITY): Payer: Self-pay

## 2024-04-29 ENCOUNTER — Ambulatory Visit (INDEPENDENT_AMBULATORY_CARE_PROVIDER_SITE_OTHER): Admitting: Neurology

## 2024-04-29 ENCOUNTER — Other Ambulatory Visit (HOSPITAL_COMMUNITY): Payer: Self-pay

## 2024-04-29 ENCOUNTER — Encounter: Payer: Self-pay | Admitting: Neurology

## 2024-04-29 VITALS — BP 110/72 | HR 64 | Ht 69.0 in | Wt 213.0 lb

## 2024-04-29 DIAGNOSIS — G40909 Epilepsy, unspecified, not intractable, without status epilepticus: Secondary | ICD-10-CM

## 2024-04-29 MED ORDER — LEVETIRACETAM 1000 MG PO TABS
1000.0000 mg | ORAL_TABLET | Freq: Two times a day (BID) | ORAL | 3 refills | Status: AC
  Filled 2024-04-29 – 2024-06-30 (×2): qty 180, 90d supply, fill #0
  Filled 2024-09-22: qty 180, 90d supply, fill #1

## 2024-04-29 NOTE — Patient Instructions (Signed)
 Good to see you! Continue to monitor exhaustion and sleep over the next few months. Continue Levetiracetam  (Keppra ) 1000mg  twice a day. Follow-up in 6 months, call for any changes.   Seizure Precautions: 1. If medication has been prescribed for you to prevent seizures, take it exactly as directed.  Do not stop taking the medicine without talking to your doctor first, even if you have not had a seizure in a long time.   2. Avoid activities in which a seizure would cause danger to yourself or to others.  Don't operate dangerous machinery, swim alone, or climb in high or dangerous places, such as on ladders, roofs, or girders.  Do not drive unless your doctor says you may.  3. If you have any warning that you may have a seizure, lay down in a safe place where you can't hurt yourself.    4.  No driving for 6 months from last seizure, as per [place]  state law.   Please refer to the following link on the Epilepsy Foundation of America's website for more information: http://www.epilepsyfoundation.org/answerplace/Social/driving/drivingu.cfm   5.  Maintain good sleep hygiene. Avoid alcohol.  6.  Notify your neurology if you are planning pregnancy or if you become pregnant.  7.  Contact your doctor if you have any problems that may be related to the medicine you are taking.  8.  Call 911 and bring the patient back to the ED if:        A.  The seizure lasts longer than 5 minutes.       B.  The patient doesn't awaken shortly after the seizure  C.  The patient has new problems such as difficulty seeing, speaking or moving  D.  The patient was injured during the seizure  E.  The patient has a temperature over 102 F (39C)  F.  The patient vomited and now is having trouble breathing

## 2024-04-29 NOTE — Progress Notes (Signed)
 NEUROLOGY FOLLOW UP OFFICE NOTE  Wendy Vega [id number] 10-25-1995  HISTORY OF PRESENT ILLNESS: I had the pleasure of seeing Wendy Vega in follow-up in the neurology clinic on 04/29/2024.  The patient was last seen 5 months ago for epilepsy.  Records and images were personally reviewed where available.  Her EEG in 11/2023 was normal. She shows a copy of her sleep study done 02/2024, no significant sleep apnea by 4% index, mild by 3% hypopnea index. Sleep study was ordered due to daytime exhaustion, she can sleep 14 hours if no alarm. Usually she gets 8-9 hours of sleep. Her partner has mentioned she snores and sometimes may have apnea. She has been seizure-free since 11/2022 on Levetiracetam  1000mg  BID without side effects. She denies any staring/unresponsive episodes, gaps in time, olfactory/gustatory hallucinations, focal numbness/tingling/weakness, myoclonic jerks. No headaches, dizziness, vision changes, no falls. Mood is okay. She is leaving her current job and thinks this is help with exhaustion and mood as well. She rarely drinks alcohol.    History on Initial Assessment 12/02/2023: This is a 28 year old right-handed woman with a history of ADD, MDD, GAD, presenting to establish care for epilepsy. She was previously seen by neurologist Dr. Gerard at [name] 6 months ago, records unavailable for review. She was diagnosed with epilepsy in 2021. She was at a music festival and asleep in a tent then woke up in the hospital. Her friends heard her groaning and witnessed a convulsion lasting 2-3 minutes. She bit her lip, no focal weakness. The next seizure occurred 6 months after, it was the morning after another concert and they were at a hotel. She was brushing her teeth, no warning symptoms, then woke up in bed with EMS around her. Her friend heard her fall and saw her convulsing. She was started on Levetiracetam  at that time. She reports having 4-5 seizures in total, last seizure  was 11/19/22, it was another nocturnal seizure. Levetiracetam  dose increased to 1000mg  BID at that time. She recalls having flashes of deja vu with epigastric sensation (stomach drop roller coaster feeling) when she was in high school, she recalls having an EEG. EEG done 01/2013 was normal. She denies any staring/unresponsive episodes, gaps in time, olfactory/gustatory hallucinations, focal numbness/tingling/weakness, myoclonic jerks. She used to have panic attacks. Memory has been iffy since the seizures started. She denies missing medications, bill payments, she denies getting lost driving.   She had aseptic meningitis in 2023. She denies any headaches. Every once in a while, she feels a whooshy feeling like a small wave that goes away quickly. She denies any diplopia, dysarthria/dysphagia, neck/back pain, bowel/bladder dysfunction. She notes that energy and libido are non-existent, she has been taking Lexapro  for 2-3 years. Mood is stable/stable but she feels kind of tired, a little bit separated. No pregnancy plans, she has the Mirena IUD. She lives alone. She works as a Naval architect.   Diagnostic Data available for review: EEGs: EEG in 2014 normal Wake and sleep EEG in 11/2023 normal  MRI: MRI brain without contrast at [facility] done 09/2021 normal (images unavailable for review)  PAST MEDICAL HISTORY: Past Medical History:  Diagnosis Date   AKI (acute kidney injury) (HCC)    Allergy 1998   Possible sulfa allergy   Anxiety    Depression    Elevated CK 02/05/2022   GERD (gastroesophageal reflux disease) 2018   No current issues   Hyperkalemia 02/08/2022   Metabolic acidosis, normal anion gap (NAG) 02/08/2022  Rash 02/05/2022   Seizures (HCC) July 2021   Medicated. Over 6 mo since last seizure.   Spinal puncture headache 02/11/2022    MEDICATIONS: Current Outpatient Medications on File Prior to Visit  Medication Sig Dispense Refill   amphetamine-dextroamphetamine (ADDERALL)  15 MG tablet Take 15 mg by mouth 2 (two) times daily.     doxycycline  (VIBRA -TABS) 100 MG tablet Take one tablet (100 mg dose) by mouth 2 (two) times daily for 7 days. 14 tablet 0   escitalopram  (LEXAPRO ) 20 MG tablet TAKE 1 TABLET BY MOUTH DAILY. 90 tablet 3   escitalopram  (LEXAPRO ) 20 MG tablet Take 1 tablet (20 mg total) by mouth daily. 30 tablet 3   escitalopram  (LEXAPRO ) 20 MG tablet Take 1 tablet (20 mg total) by mouth daily. 90 tablet 3   levETIRAcetam  (KEPPRA ) 1000 MG tablet Take 1 tablet (1,000 mg total) by mouth 2 (two) times daily. 180 tablet 0   levonorgestrel (LILETTA, 52 MG,) 20.1 MCG/DAY IUD IUD 1 each by Intrauterine route once.     lisdexamfetamine (VYVANSE ) 20 MG capsule Take one capsule by mouth twice daily. (Patient not taking: Reported on 12/02/2023) 60 capsule 0   lisdexamfetamine (VYVANSE ) 20 MG capsule Take 1 capsule (20 mg total) by mouth in the morning for 15 days then take 1 capsule twice daily 45 capsule 0   lisdexamfetamine (VYVANSE ) 20 MG capsule Take 1 capsule (20 mg total) by mouth 2 (two) times daily. 60 capsule 0   lisdexamfetamine (VYVANSE ) 20 MG capsule Take 1 capsule (20 mg total) by mouth 2 (two) times daily. 60 capsule 0   lisdexamfetamine (VYVANSE ) 20 MG capsule Take 1 capsule (20 mg total) by mouth 2 (two) times daily. 02/18/24 60 capsule 0   [START ON 06/13/2024] lisdexamfetamine (VYVANSE ) 20 MG capsule Take 1 capsule (20 mg total) by mouth 2 (two) times daily. 60 capsule 0   [START ON 05/16/2024] lisdexamfetamine (VYVANSE ) 20 MG capsule Take 1 capsule (20 mg total) by mouth 2 (two) times daily. 60 capsule 0   lisdexamfetamine (VYVANSE ) 20 MG capsule Take 1 capsule (20 mg total) by mouth 2 (two) times daily. 60 capsule 0   No current facility-administered medications on file prior to visit.    ALLERGIES: Allergies  Allergen Reactions   Sulfa Antibiotics Other (See Comments)    Lips turn blue and swelling    Sulfacetamide Sodium Other (See Comments)     Lips turn blue and swelling     FAMILY HISTORY: Family History  Problem Relation Age of Onset   Cancer Other    Hyperlipidemia Other     SOCIAL HISTORY: Social History   Socioeconomic History   Marital status: Single    Spouse name: Not on file   Number of children: Not on file   Years of education: Not on file   Highest education level: Not on file  Occupational History   Not on file  Tobacco Use   Smoking status: Never   Smokeless tobacco: Never   Tobacco comments:    Smoked 2 packs of cig daily for 2 years. Quit cig in 2018 and got a vape. Ready to quit.  Vaping Use   Vaping status: Every Day  Substance and Sexual Activity   Alcohol use: Yes    Alcohol/week: 2.0 standard drinks of alcohol    Types: 1 Glasses of wine, 1 Cans of beer per week    Comment: occ   Drug use: Yes    Frequency: 7.0 times per  week    Types: Marijuana   Sexual activity: Yes    Birth control/protection: Other-see comments, None    Comment: Monogamous with female partner. No risk of pregnancy.  Other Topics Concern   Not on file  Social History Narrative   Are you right handed or left handed? Right    Are you currently employed ? 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 alone   Who lives with you? NA   What type of home do you live in: 1 story or 2 story?  2nd story apartment         Social Drivers of Corporate investment banker Strain: Not on file  Food Insecurity: Not on file  Transportation Needs: Not on file  Physical Activity: Not on file  Stress: Not on file  Social Connections: Not on file  Intimate Partner Violence: Not on file     PHYSICAL EXAM: Vitals:   04/29/24 1342  BP: 110/72  Pulse: 64  SpO2: 97%   General: No acute distress Head:  Normocephalic/atraumatic Skin/Extremities: No rash, no edema Neurological Exam: alert and awake. No aphasia or dysarthria. Fund of knowledge is appropriate.  Attention and  concentration are normal.   Cranial nerves: Pupils equal, round. Extraocular movements intact with no nystagmus. Visual fields full.  No facial asymmetry.  Motor: Bulk and tone normal, muscle strength 5/5 throughout with no pronator drift.   Finger to nose testing intact.  Gait narrow-based and steady, able to tandem walk adequately.  Romberg negative.   IMPRESSION: This is a 28 yo RH woman with a history of ADD, MDD, GAD, with epilepsy, etiology unclear. 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 EEG in 11/2023 was normal. She is doing well seizure-free since 11/2022 on Levetiracetam  1000mg  BID, refills sent. We reviewed her home sleep study, continue to monitor symptoms for now. She is aware of [place] driving laws to stop driving after a seizure until 6 months seizure-free. Follow-up in 6 months, call for any changes.    Thank you for allowing me to participate in her care.  Please do not hesitate to call for any questions or concerns.    Darice Shivers, M.D.   CC: Corean Comment, NP

## 2024-06-30 ENCOUNTER — Other Ambulatory Visit (HOSPITAL_COMMUNITY): Payer: Self-pay

## 2024-07-05 ENCOUNTER — Other Ambulatory Visit (HOSPITAL_COMMUNITY): Payer: Self-pay

## 2024-07-16 ENCOUNTER — Other Ambulatory Visit (HOSPITAL_COMMUNITY): Payer: Self-pay

## 2024-07-20 ENCOUNTER — Other Ambulatory Visit (HOSPITAL_COMMUNITY): Payer: Self-pay

## 2024-07-20 MED ORDER — VENLAFAXINE HCL ER 75 MG PO CP24
75.0000 mg | ORAL_CAPSULE | Freq: Every day | ORAL | 2 refills | Status: AC
  Filled 2024-07-20: qty 30, 30d supply, fill #0
  Filled 2024-09-22 – 2024-10-27 (×4): qty 30, 30d supply, fill #1

## 2024-07-20 MED ORDER — LISDEXAMFETAMINE DIMESYLATE 20 MG PO CAPS
20.0000 mg | ORAL_CAPSULE | Freq: Every morning | ORAL | 0 refills | Status: AC
  Filled 2024-07-20: qty 14, 14d supply, fill #0

## 2024-07-20 MED ORDER — LISDEXAMFETAMINE DIMESYLATE 40 MG PO CAPS
40.0000 mg | ORAL_CAPSULE | Freq: Every morning | ORAL | 0 refills | Status: DC
  Filled 2024-07-20: qty 30, 30d supply, fill #0

## 2024-08-17 ENCOUNTER — Other Ambulatory Visit (HOSPITAL_COMMUNITY): Payer: Self-pay

## 2024-08-17 MED ORDER — LISDEXAMFETAMINE DIMESYLATE 40 MG PO CAPS
40.0000 mg | ORAL_CAPSULE | Freq: Every morning | ORAL | 0 refills | Status: AC
  Filled 2024-08-17: qty 30, 30d supply, fill #0

## 2024-08-17 MED ORDER — LISDEXAMFETAMINE DIMESYLATE 40 MG PO CAPS
40.0000 mg | ORAL_CAPSULE | Freq: Every morning | ORAL | 0 refills | Status: AC
  Filled 2024-10-26: qty 30, 30d supply, fill #0

## 2024-08-17 MED ORDER — LISDEXAMFETAMINE DIMESYLATE 40 MG PO CAPS
40.0000 mg | ORAL_CAPSULE | Freq: Every morning | ORAL | 0 refills | Status: AC
  Filled 2024-09-22: qty 30, 30d supply, fill #0

## 2024-08-17 MED ORDER — VENLAFAXINE HCL ER 75 MG PO CP24
75.0000 mg | ORAL_CAPSULE | Freq: Every morning | ORAL | 2 refills | Status: AC
  Filled 2024-08-17: qty 90, 90d supply, fill #0

## 2024-08-19 ENCOUNTER — Encounter: Payer: Self-pay | Admitting: Neurology

## 2024-09-22 ENCOUNTER — Other Ambulatory Visit (HOSPITAL_COMMUNITY): Payer: Self-pay

## 2024-09-22 ENCOUNTER — Other Ambulatory Visit: Payer: Self-pay

## 2024-09-23 ENCOUNTER — Other Ambulatory Visit (HOSPITAL_COMMUNITY): Payer: Self-pay

## 2024-09-23 ENCOUNTER — Other Ambulatory Visit: Payer: Self-pay

## 2024-09-24 ENCOUNTER — Other Ambulatory Visit (HOSPITAL_COMMUNITY): Payer: Self-pay

## 2024-09-28 ENCOUNTER — Other Ambulatory Visit (HOSPITAL_COMMUNITY): Payer: Self-pay

## 2024-10-26 ENCOUNTER — Other Ambulatory Visit: Payer: Self-pay

## 2024-10-26 ENCOUNTER — Other Ambulatory Visit (HOSPITAL_COMMUNITY): Payer: Self-pay

## 2024-11-04 ENCOUNTER — Other Ambulatory Visit (HOSPITAL_COMMUNITY): Payer: Self-pay

## 2024-12-05 ENCOUNTER — Ambulatory Visit: Admitting: Neurology
